# Patient Record
Sex: Female | Born: 1995 | Race: White | Hispanic: No | Marital: Single | Smoking: Former smoker
Health system: Southern US, Community
[De-identification: ages and names within clinical notes are randomized; demographics above are authoritative.]

## PROBLEM LIST (undated history)

## (undated) DIAGNOSIS — Z87440 Personal history of urinary (tract) infections: Secondary | ICD-10-CM

## (undated) DIAGNOSIS — H509 Unspecified strabismus: Secondary | ICD-10-CM

## (undated) DIAGNOSIS — N926 Irregular menstruation, unspecified: Secondary | ICD-10-CM

## (undated) HISTORY — PX: LEEP: SHX91

## (undated) HISTORY — DX: Irregular menstruation, unspecified: N92.6

## (undated) HISTORY — DX: Unspecified strabismus: H50.9

## (undated) HISTORY — DX: Personal history of urinary (tract) infections: Z87.440

## (undated) HISTORY — PX: WISDOM TOOTH EXTRACTION: SHX21

## (undated) HISTORY — PX: EYE SURGERY: SHX253

---
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
## 2014-09-15 ENCOUNTER — Encounter: Payer: Self-pay | Admitting: Family Medicine

## 2014-09-15 ENCOUNTER — Ambulatory Visit (INDEPENDENT_AMBULATORY_CARE_PROVIDER_SITE_OTHER): Payer: BLUE CROSS/BLUE SHIELD | Admitting: Family Medicine

## 2014-09-15 VITALS — BP 110/60 | HR 86 | Temp 98.1°F | Ht 66.0 in | Wt 120.0 lb

## 2014-09-15 DIAGNOSIS — Z113 Encounter for screening for infections with a predominantly sexual mode of transmission: Secondary | ICD-10-CM

## 2014-09-15 DIAGNOSIS — Z30019 Encounter for initial prescription of contraceptives, unspecified: Secondary | ICD-10-CM | POA: Diagnosis not present

## 2014-09-15 DIAGNOSIS — Z0000 Encounter for general adult medical examination without abnormal findings: Secondary | ICD-10-CM

## 2014-09-15 DIAGNOSIS — Z309 Encounter for contraceptive management, unspecified: Secondary | ICD-10-CM | POA: Insufficient documentation

## 2014-09-15 NOTE — Addendum Note (Signed)
Addended by: Ellamae Sia on: 09/15/2014 04:45 PM   Modules accepted: Orders

## 2014-09-15 NOTE — Assessment & Plan Note (Signed)
Education given regarding options for contraception, including barrier methods, injectable contraception, IUD placement, oral contraceptives. We agreed that condoms and depo provera injections would work well for her.  She wants to check with her insurance company first.

## 2014-09-15 NOTE — Patient Instructions (Addendum)
Good to see you. I think you should really consider depo provera every 3 month shots.  Give [place] a call once you figure our your health insurance.  Please make sure you use condoms with every sexual encounter.  We will call you with your lab results.

## 2014-09-15 NOTE — Addendum Note (Signed)
Addended by: Modena Nunnery on: 09/15/2014 02:06 PM   Modules accepted: Orders

## 2014-09-15 NOTE — Assessment & Plan Note (Signed)
Reviewed preventive care protocols, scheduled due services, and updated immunizations Discussed nutrition, exercise, diet, and healthy lifestyle.  

## 2014-09-15 NOTE — Progress Notes (Signed)
Subjective:   Patient ID: Jennifer [name], female    DOB: [birth year], 19 y.o.   MRN: [id number]  Jennifer [name] is a pleasant 19 y.o. year old female who presents to clinic today with Annual Exam and Contraception  on 09/15/2014  HPI:  Contraception management- Was taking Orthocyclen.  Forgets to take pill- wants to discuss other options for contraception- has not taken it in months.  She is currently sexually active.  She thinks her partner may have tested positive for chlamydia. She is not having any discharge or pelvic pain.  Otherwise doing well.   Current Outpatient Prescriptions on File Prior to Visit  Medication Sig Dispense Refill  . norgestimate-ethinyl estradiol (ORTHO-CYCLEN,SPRINTEC,PREVIFEM) 0.25-35 MG-MCG tablet Take 1 tablet by mouth daily. 3 Package 2   No current facility-administered medications on file prior to visit.    Allergies  Allergen Reactions  . Sulfa Antibiotics Hives    Past Medical History  Diagnosis Date  . Strabismus   . Irregular periods     No past surgical history on file.  Family History  Problem Relation Age of Onset  . Alcohol abuse Mother     History   Social History  . Marital Status: Single    Spouse Name: N/A  . Number of Children: N/A  . Years of Education: N/A   Occupational History  . Not on file.   Social History Main Topics  . Smoking status: Current Every Day Smoker  . Smokeless tobacco: Never Used  . Alcohol Use: Yes  . Drug Use: No  . Sexual Activity: Not on file   Other Topics Concern  . Not on file   Social History Narrative   10th grader, gets straight As.   Sexually active, uses condoms.   Lives with dad.   Mom is recovering alcoholic.   Has good support system- both grandmothers live near by.     The PMH, PSH, Social History, Family History, Medications, and allergies have been reviewed in [facility], and have been updated if relevant.  Review of Systems  Constitutional: Negative.     HENT: Negative.   Respiratory: Negative.   Cardiovascular: Negative.   Gastrointestinal: Negative.   Endocrine: Negative.   Genitourinary: Negative.   Musculoskeletal: Negative.   Skin: Negative.   Allergic/Immunologic: Negative.   Neurological: Negative.   Hematological: Negative.   Psychiatric/Behavioral: Negative.   All other systems reviewed and are negative.      Objective:    BP 110/60 mmHg  Pulse 86  Temp(Src) 98.1 F (36.7 C) (Oral)  Ht 5\' 6"  (1.676 m)  Wt 120 lb (54.432 kg)  BMI 19.38 kg/m2  SpO2 97%  LMP 08/31/2014 (Within Weeks)   Physical Exam  Constitutional: She is oriented to person, place, and time. She appears well-developed and well-nourished. No distress.  HENT:  Head: Normocephalic.  Eyes: Conjunctivae are normal.  Neck: Normal range of motion.  Cardiovascular: Normal rate and regular rhythm.   Pulmonary/Chest: Effort normal and breath sounds normal.  Abdominal: Soft. Bowel sounds are normal. She exhibits no distension and no mass. There is no tenderness. There is no rebound and no guarding. Hernia confirmed negative in the right inguinal area and confirmed negative in the left inguinal area.  Genitourinary: Rectum normal, vagina normal and uterus normal. No breast swelling or tenderness. There is no rash, tenderness, lesion or injury on the right labia. There is no rash, tenderness, lesion or injury on the left labia. Cervix exhibits no motion tenderness.  Right adnexum displays no mass, no tenderness and no fullness. Left adnexum displays no mass, no tenderness and no fullness.  Musculoskeletal: Normal range of motion.  Lymphadenopathy:       Right: No inguinal adenopathy present.       Left: No inguinal adenopathy present.  Neurological: She is alert and oriented to person, place, and time. No cranial nerve deficit.  Nursing note and vitals reviewed.         Assessment & Plan:   Routine general medical examination at a health care  facility  Screening for STD (sexually transmitted disease) - Plan: HIV antibody (with reflex), RPR, HSV(herpes simplex vrs) 1+2 ab-IgM  Encounter for initial prescription of contraceptives No Follow-up on file.

## 2014-09-15 NOTE — Assessment & Plan Note (Signed)
Counseled pt on using condoms with every sexual encounter. Test for GC/Chlamydia, HIV, HSV and RPR today. The patient indicates understanding of these issues and agrees with the plan.

## 2014-09-15 NOTE — Progress Notes (Signed)
Pre visit review using our clinic review tool, if applicable. No additional management support is needed unless otherwise documented below in the visit note. 

## 2014-09-16 LAB — HIV ANTIBODY (ROUTINE TESTING W REFLEX): HIV 1&2 Ab, 4th Generation: NONREACTIVE

## 2014-09-16 LAB — GC/CHLAMYDIA PROBE AMP
CT Probe RNA: POSITIVE — AB
GC Probe RNA: NEGATIVE

## 2014-09-16 LAB — RPR

## 2014-09-16 MED ORDER — AZITHROMYCIN 500 MG PO TABS: 1000.0000 mg | ORAL_TABLET | Freq: Once | ORAL | Status: DC

## 2014-09-16 NOTE — Addendum Note (Signed)
Addended by: Modena Nunnery on: 09/16/2014 03:24 PM   Modules accepted: Orders

## 2014-09-17 LAB — HSV(HERPES SIMPLEX VRS) I + II AB-IGM: Herpes Simplex Vrs I&II-IgM Ab (EIA): 2.25 INDEX — ABNORMAL HIGH

## 2014-09-24 ENCOUNTER — Telehealth: Payer: Self-pay | Admitting: Family Medicine

## 2014-09-24 NOTE — Telephone Encounter (Signed)
brandi called needed treatment information on Jennifer [name]

## 2014-09-24 NOTE — Telephone Encounter (Signed)
Spoke to brandi @ [facility] and advised her of pts recent Tx

## 2014-12-21 ENCOUNTER — Ambulatory Visit: Payer: Self-pay | Admitting: Family Medicine

## 2014-12-31 ENCOUNTER — Encounter: Payer: Self-pay | Admitting: Family Medicine

## 2014-12-31 ENCOUNTER — Ambulatory Visit (INDEPENDENT_AMBULATORY_CARE_PROVIDER_SITE_OTHER): Payer: BLUE CROSS/BLUE SHIELD | Admitting: Family Medicine

## 2014-12-31 VITALS — BP 106/60 | HR 66 | Temp 97.9°F | Wt 124.2 lb

## 2014-12-31 DIAGNOSIS — Z30019 Encounter for initial prescription of contraceptives, unspecified: Secondary | ICD-10-CM | POA: Diagnosis not present

## 2014-12-31 DIAGNOSIS — Z113 Encounter for screening for infections with a predominantly sexual mode of transmission: Secondary | ICD-10-CM

## 2014-12-31 DIAGNOSIS — Z0000 Encounter for general adult medical examination without abnormal findings: Secondary | ICD-10-CM | POA: Diagnosis not present

## 2014-12-31 LAB — LIPID PANEL
Cholesterol: 153 mg/dL (ref 0–200)
HDL: 46 mg/dL (ref >39.00)
LDL Cholesterol: 92 mg/dL (ref 0–99)
NonHDL: 106.72
Total CHOL/HDL Ratio: 3
Triglycerides: 72 mg/dL (ref 0.0–149.0)
VLDL: 14.4 mg/dL (ref 0.0–40.0)

## 2014-12-31 LAB — COMPREHENSIVE METABOLIC PANEL
ALT: 13 U/L (ref 0–35)
AST: 15 U/L (ref 0–37)
Albumin: 4.7 g/dL (ref 3.5–5.2)
Alkaline Phosphatase: 80 U/L (ref 39–117)
BUN: 10 mg/dL (ref 6–23)
CO2: 28 mEq/L (ref 19–32)
Calcium: 9.7 mg/dL (ref 8.4–10.5)
Chloride: 103 mEq/L (ref 96–112)
Creatinine, Ser: 0.71 mg/dL (ref 0.40–1.20)
GFR: 111.07 mL/min (ref >60.00)
Glucose, Bld: 74 mg/dL (ref 70–99)
Potassium: 3.8 mEq/L (ref 3.5–5.1)
Sodium: 140 mEq/L (ref 135–145)
Total Bilirubin: 0.4 mg/dL (ref 0.2–1.2)
Total Protein: 7.9 g/dL (ref 6.0–8.3)

## 2014-12-31 LAB — CBC WITH DIFFERENTIAL/PLATELET
Basophils Absolute: 0 10*3/uL (ref 0.0–0.1)
Basophils Relative: 0.4 % (ref 0.0–3.0)
Eosinophils Absolute: 0.1 10*3/uL (ref 0.0–0.7)
Eosinophils Relative: 0.6 % (ref 0.0–5.0)
HCT: 44.3 % (ref 36.0–46.0)
Hemoglobin: 14.9 g/dL (ref 12.0–15.0)
Lymphocytes Relative: 19.4 % (ref 12.0–46.0)
Lymphs Abs: 2 10*3/uL (ref 0.7–4.0)
MCHC: 33.6 g/dL (ref 30.0–36.0)
MCV: 92.3 fl (ref 78.0–100.0)
Monocytes Absolute: 0.7 10*3/uL (ref 0.1–1.0)
Monocytes Relative: 6.9 % (ref 3.0–12.0)
Neutro Abs: 7.5 10*3/uL (ref 1.4–7.7)
Neutrophils Relative %: 72.7 % (ref 43.0–77.0)
Platelets: 271 10*3/uL (ref 150.0–400.0)
RBC: 4.8 Mil/uL (ref 3.87–5.11)
RDW: 12.9 % (ref 11.5–14.6)
WBC: 10.3 10*3/uL (ref 4.5–10.5)

## 2014-12-31 LAB — TSH: TSH: 2.08 u[IU]/mL (ref 0.35–5.50)

## 2014-12-31 MED ORDER — MEDROXYPROGESTERONE ACETATE 150 MG/ML IM SUSP
150.0000 mg | Freq: Once | INTRAMUSCULAR | Status: AC
  Administered 2014-12-31: 150 mg via INTRAMUSCULAR

## 2014-12-31 NOTE — Assessment & Plan Note (Signed)
Discussed dangers of smoking, alcohol, and drug abuse.  Also discussed sexual activity, pregnancy risk, and STD risk.  Encouraged to get regular exercise.  Orders Placed This Encounter  Procedures  . CBC with Differential/Platelet  . Comprehensive metabolic panel  . Lipid panel  . TSH  . HIV antibody (with reflex)  . RPR

## 2014-12-31 NOTE — Addendum Note (Signed)
Addended by: Modena Nunnery on: 12/31/2014 01:25 PM   Modules accepted: Orders

## 2014-12-31 NOTE — Progress Notes (Signed)
Pre visit review using our clinic review tool, if applicable. No additional management support is needed unless otherwise documented below in the visit note. 

## 2014-12-31 NOTE — Patient Instructions (Signed)
Good luck at [facility]! We will call you with your results from today!

## 2014-12-31 NOTE — Assessment & Plan Note (Signed)
Education given regarding options for contraception, including barrier methods, injectable contraception, IUD placement, oral contraceptives  She is interested in IM depo- first injection received today.

## 2014-12-31 NOTE — Addendum Note (Signed)
Addended by: Ellamae Sia on: 12/31/2014 01:32 PM   Modules accepted: Orders

## 2014-12-31 NOTE — Addendum Note (Signed)
Addended by: Modena Nunnery on: 12/31/2014 01:36 PM   Modules accepted: Orders

## 2014-12-31 NOTE — Progress Notes (Signed)
Patient ID: Jennifer [name], female   DOB: 05/09/1996, 19 y.o.   MRN: [id number]   Subjective:   Patient ID: Jennifer [name], female    DOB: [birth year], 19 y.o.   MRN: [id number]  Jennifer [name] is a pleasant 19 y.o. year old female who presents to clinic today with Follow-up and Contraception  on 12/31/2014  HPI:  Doing well.  Still sexually active with same partner.  Remote h/o chlamydia.  Wants to be retested for Chlamydia.  Also wants to discussed birth control options.  Does not want to have to remember to take a pill every day.  Lab Results  Component Value Date   CHOL 147 10/11/2010   HDL 44.20 10/11/2010   LDLCALC 92 10/11/2010   TRIG 53.0 10/11/2010   CHOLHDL 3 10/11/2010   Lab Results  Component Value Date   WBC 7.4 10/11/2010   HGB 11.9* 10/11/2010   HCT 35.0* 10/11/2010   MCV 87.9 10/11/2010   PLT 286.0 10/11/2010   No results found for: TSH No current outpatient prescriptions on file prior to visit.   No current facility-administered medications on file prior to visit.    Allergies  Allergen Reactions  . Sulfa Antibiotics Hives    Past Medical History  Diagnosis Date  . Strabismus   . Irregular periods     No past surgical history on file.  Family History  Problem Relation Age of Onset  . Alcohol abuse Mother     History   Social History  . Marital Status: Single    Spouse Name: N/A  . Number of Children: N/A  . Years of Education: N/A   Occupational History  . Not on file.   Social History Main Topics  . Smoking status: Current Every Day Smoker  . Smokeless tobacco: Never Used  . Alcohol Use: Yes  . Drug Use: No  . Sexual Activity: Not on file   Other Topics Concern  . Not on file   Social History Narrative   Attending [employer] in the fall.   Sexually active, uses condoms.   Lives with dad.   Mom is recovering alcoholic.   Has good support system- both grandmothers live near by.     The PMH, PSH, Social  History, Family History, Medications, and allergies have been reviewed in [facility], and have been updated if relevant.    Review of Systems  Constitutional: Negative.   HENT: Negative.   Eyes: Negative.   Respiratory: Negative.   Cardiovascular: Negative.   Gastrointestinal: Negative.   Genitourinary: Negative.   Musculoskeletal: Negative.   Skin: Negative.   Allergic/Immunologic: Negative.   Neurological: Negative.   Hematological: Negative.   Psychiatric/Behavioral: Negative.        Objective:    BP 106/60 mmHg  Pulse 66  Temp(Src) 97.9 F (36.6 C) (Oral)  Wt 124 lb 4 oz (56.359 kg)  SpO2 97%  LMP 12/04/2014 (Within Weeks)   Physical Exam   General:  Well-developed,well-nourished,in no acute distress; alert,appropriate and cooperative throughout examination Head:  normocephalic and atraumatic.   Eyes:  vision grossly intact, pupils equal, pupils round, and pupils reactive to light.   Ears:  R ear normal and L ear normal.   Nose:  no external deformity.   Mouth:  good dentition.   Neck:  No deformities, masses, or tenderness noted. Breasts:  No mass, nodules, thickening, tenderness, bulging, retraction, inflamation, nipple discharge or skin changes noted.   Lungs:  Normal respiratory effort, chest expands  symmetrically. Lungs are clear to auscultation, no crackles or wheezes. Heart:  Normal rate and regular rhythm. S1 and S2 normal without gallop, murmur, click, rub or other extra sounds. Abdomen:  Bowel sounds positive,abdomen soft and non-tender without masses, organomegaly or hernias noted.d Msk:  No deformity or scoliosis noted of thoracic or lumbar spine.   Extremities:  No clubbing, cyanosis, edema, or deformity noted with normal full range of motion of all joints.   Neurologic:  alert & oriented X3 and gait normal.   Skin:  Intact without suspicious lesions or rashes Cervical Nodes:  No lymphadenopathy noted Axillary Nodes:  No palpable lymphadenopathy Psych:   Cognition and judgment appear intact. Alert and cooperative with normal attention span and concentration. No apparent delusions, illusions, hallucinations       Assessment & Plan:   Routine general medical examination at a health care facility  Screening for STD (sexually transmitted disease)  Encounter for initial prescription of contraceptives No Follow-up on file.

## 2015-01-01 LAB — GC/CHLAMYDIA PROBE AMP
CT Probe RNA: POSITIVE — AB
GC Probe RNA: NEGATIVE

## 2015-01-01 LAB — RPR

## 2015-01-01 LAB — HIV ANTIBODY (ROUTINE TESTING W REFLEX): HIV 1&2 Ab, 4th Generation: NONREACTIVE

## 2015-01-04 MED ORDER — AZITHROMYCIN 500 MG PO TABS: 1000.0000 mg | ORAL_TABLET | Freq: Once | ORAL | Status: DC

## 2015-01-04 NOTE — Addendum Note (Signed)
Addended by: Modena Nunnery on: 01/04/2015 10:59 AM   Modules accepted: Orders

## 2015-01-08 ENCOUNTER — Telehealth: Payer: Self-pay

## 2015-01-08 NOTE — Telephone Encounter (Signed)
Yes but we do need a test of cure to be sure.

## 2015-01-08 NOTE — Telephone Encounter (Signed)
Pt was seen 12/31/14 and pt took azithromycin 500 mg two tabs on 01/04/15;pt wants to know how soon the med is effective and is pt cured of chlamydia now. Pt understands she needs to return in 3 months for retesting for chlamydia.

## 2015-01-11 NOTE — Telephone Encounter (Signed)
Lm on pts vm requesting a call back 

## 2015-01-19 ENCOUNTER — Telehealth: Payer: Self-pay | Admitting: Family Medicine

## 2015-01-19 NOTE — Telephone Encounter (Signed)
Spoke to [facility] and confirmed pt received Tx for Dx

## 2015-01-19 NOTE — Telephone Encounter (Signed)
Jamas Lav from the [facility] Dept called regarding pt diagnosis and to see if treatment is completed. Please call [phone]. Thanks

## 2015-01-22 ENCOUNTER — Ambulatory Visit (INDEPENDENT_AMBULATORY_CARE_PROVIDER_SITE_OTHER): Payer: BLUE CROSS/BLUE SHIELD

## 2015-01-22 DIAGNOSIS — Z23 Encounter for immunization: Secondary | ICD-10-CM | POA: Diagnosis not present

## 2015-02-02 ENCOUNTER — Ambulatory Visit: Payer: BLUE CROSS/BLUE SHIELD

## 2015-03-23 ENCOUNTER — Ambulatory Visit: Payer: BLUE CROSS/BLUE SHIELD

## 2015-03-23 ENCOUNTER — Other Ambulatory Visit (INDEPENDENT_AMBULATORY_CARE_PROVIDER_SITE_OTHER): Payer: BLUE CROSS/BLUE SHIELD

## 2015-03-23 ENCOUNTER — Other Ambulatory Visit: Payer: Self-pay | Admitting: Internal Medicine

## 2015-03-23 DIAGNOSIS — N912 Amenorrhea, unspecified: Secondary | ICD-10-CM

## 2015-03-23 NOTE — Progress Notes (Signed)
Pt had nurse visit scheduled for Depo. Pt came in stating she had concerns of possible pregnancy. HcG Urine pregnancy test was done and results were negative. Patient wanted blood work done to make sure that it was a definite negative. Labs ordered, and blood drawn today. Patient will return for Depo upon receiving lab results.

## 2015-03-24 ENCOUNTER — Ambulatory Visit (INDEPENDENT_AMBULATORY_CARE_PROVIDER_SITE_OTHER): Payer: BLUE CROSS/BLUE SHIELD | Admitting: *Deleted

## 2015-03-24 DIAGNOSIS — Z308 Encounter for other contraceptive management: Secondary | ICD-10-CM

## 2015-03-24 LAB — HCG, SERUM, QUALITATIVE: Preg, Serum: <2

## 2015-03-24 MED ORDER — MEDROXYPROGESTERONE ACETATE 150 MG/ML IM SUSP
150.0000 mg | Freq: Once | INTRAMUSCULAR | Status: AC
  Administered 2015-03-24: 150 mg via INTRAMUSCULAR

## 2015-04-08 ENCOUNTER — Encounter: Payer: Self-pay | Admitting: Family Medicine

## 2015-04-08 ENCOUNTER — Ambulatory Visit (INDEPENDENT_AMBULATORY_CARE_PROVIDER_SITE_OTHER): Payer: BLUE CROSS/BLUE SHIELD | Admitting: Family Medicine

## 2015-04-08 VITALS — BP 112/66 | HR 70 | Temp 98.3°F | Wt 140.2 lb

## 2015-04-08 DIAGNOSIS — Z113 Encounter for screening for infections with a predominantly sexual mode of transmission: Secondary | ICD-10-CM | POA: Diagnosis not present

## 2015-04-08 DIAGNOSIS — A749 Chlamydial infection, unspecified: Secondary | ICD-10-CM | POA: Diagnosis not present

## 2015-04-08 NOTE — Progress Notes (Signed)
Pre visit review using our clinic review tool, if applicable. No additional management support is needed unless otherwise documented below in the visit note. 

## 2015-04-08 NOTE — Addendum Note (Signed)
Addended by: Daralene Milch C on: 04/08/2015 01:41 PM   Modules accepted: Orders

## 2015-04-08 NOTE — Progress Notes (Signed)
   Subjective:   Patient ID: Jennifer [name], female    DOB: [birth year], 19 y.o.   MRN: [id number]  Jennifer [name] is a pleasant 19 y.o. year old female who presents to clinic today with Follow-up  on 04/08/2015  HPI:  Treated for Chlamydia on 7/28 with 1 gram azithromycin .  Here for retesting.  Per pt, boyfriend said he was treated and was negative at follow up.  Currently not having any discharge, only menstrual spotting.  No current outpatient prescriptions on file prior to visit.   No current facility-administered medications on file prior to visit.    Allergies  Allergen Reactions  . Sulfa Antibiotics Hives    Past Medical History  Diagnosis Date  . Strabismus   . Irregular periods     No past surgical history on file.  Family History  Problem Relation Age of Onset  . Alcohol abuse Mother     Social History   Social History  . Marital Status: Single    Spouse Name: N/A  . Number of Children: N/A  . Years of Education: N/A   Occupational History  . Not on file.   Social History Main Topics  . Smoking status: Current Every Day Smoker  . Smokeless tobacco: Never Used  . Alcohol Use: Yes  . Drug Use: No  . Sexual Activity: Not on file   Other Topics Concern  . Not on file   Social History Narrative   Attending [employer] in the fall.   Sexually active, uses condoms.   Lives with dad.   Mom is recovering alcoholic.   Has good support system- both grandmothers live near by.     The PMH, PSH, Social History, Family History, Medications, and allergies have been reviewed in [facility], and have been updated if relevant.   Review of Systems  Constitutional: Negative.   Genitourinary: Negative for dysuria, flank pain, decreased urine volume, vaginal discharge, enuresis, vaginal pain, menstrual problem, pelvic pain and dyspareunia.  Neurological: Negative.   All other systems reviewed and are negative.      Objective:    BP 112/66 mmHg  Pulse 70   Temp(Src) 98.3 F (36.8 C) (Oral)  Wt 140 lb 4 oz (63.617 kg)  SpO2 98%   Physical Exam  Constitutional: She is oriented to person, place, and time. She appears well-developed and well-nourished. No distress.  HENT:  Head: Normocephalic and atraumatic.  Eyes: Conjunctivae are normal.  Cardiovascular: Normal rate.   Pulmonary/Chest: Effort normal.  Abdominal: Soft.  Genitourinary: Cervix exhibits no motion tenderness. Right adnexum displays no mass, no tenderness and no fullness.  Neurological: She is alert and oriented to person, place, and time. No cranial nerve deficit.  Skin: Skin is warm and dry.  Psychiatric: She has a normal mood and affect. Her behavior is normal. Judgment and thought content normal.          Assessment & Plan:   Chlamydia infection - Plan: GC/Chlamydia Probe Amp  Screening for STD (sexually transmitted disease) - Plan: HIV antibody (with reflex), RPR, GC/Chlamydia Probe Amp No Follow-up on file.

## 2015-04-08 NOTE — Assessment & Plan Note (Signed)
Finished course of azithromycin. Recheck today- GC/Chlamydia cervical swab done today. Orders Placed This Encounter  Procedures  . GC/Chlamydia Probe Amp  . HIV antibody (with reflex)  . RPR

## 2015-04-09 LAB — HIV ANTIBODY (ROUTINE TESTING W REFLEX): HIV 1&2 Ab, 4th Generation: NONREACTIVE

## 2015-04-09 LAB — RPR

## 2015-04-10 LAB — GC/CHLAMYDIA PROBE AMP
CT Probe RNA: NEGATIVE
GC Probe RNA: NEGATIVE

## 2015-04-12 ENCOUNTER — Ambulatory Visit: Payer: BLUE CROSS/BLUE SHIELD | Admitting: Family Medicine

## 2015-04-14 ENCOUNTER — Telehealth: Payer: Self-pay | Admitting: Family Medicine

## 2015-04-14 ENCOUNTER — Ambulatory Visit (INDEPENDENT_AMBULATORY_CARE_PROVIDER_SITE_OTHER): Payer: BLUE CROSS/BLUE SHIELD | Admitting: Family Medicine

## 2015-04-14 ENCOUNTER — Encounter: Payer: Self-pay | Admitting: Family Medicine

## 2015-04-14 ENCOUNTER — Other Ambulatory Visit (HOSPITAL_COMMUNITY)
Admission: RE | Admit: 2015-04-14 | Discharge: 2015-04-14 | Disposition: A | Discharge status: Home / Self Care | Payer: BLUE CROSS/BLUE SHIELD | Source: Ambulatory Visit | Attending: Family Medicine | Admitting: Family Medicine

## 2015-04-14 VITALS — BP 104/62 | HR 82 | Temp 98.4°F | Wt 140.5 lb

## 2015-04-14 DIAGNOSIS — Z01419 Encounter for gynecological examination (general) (routine) without abnormal findings: Secondary | ICD-10-CM | POA: Insufficient documentation

## 2015-04-14 DIAGNOSIS — Z1151 Encounter for screening for human papillomavirus (HPV): Secondary | ICD-10-CM | POA: Diagnosis not present

## 2015-04-14 DIAGNOSIS — N760 Acute vaginitis: Secondary | ICD-10-CM | POA: Insufficient documentation

## 2015-04-14 DIAGNOSIS — Z113 Encounter for screening for infections with a predominantly sexual mode of transmission: Secondary | ICD-10-CM

## 2015-04-14 DIAGNOSIS — A749 Chlamydial infection, unspecified: Secondary | ICD-10-CM | POA: Diagnosis not present

## 2015-04-14 NOTE — Assessment & Plan Note (Signed)
Discussed sexual activity, pregnancy risk, and STD risk.    STD screening today.

## 2015-04-14 NOTE — Progress Notes (Signed)
Subjective:   Patient ID: Jennifer [name], female    DOB: 30-Jun-1995, 19 y.o.   MRN: [id number]  Jennifer [name] is a pleasant 19 y.o. year old female who presents to clinic today with No chief complaint on file.  on 04/14/2015  HPI:  Was just seen last week for repeat STD testing.   Treated for Chlamydia on 7/28 with 1 gram azithromycin.  Per pt, boyfriend said he was treated and was negative at follow up.  GC/Chlamydia probe neg on 04/08/15. HIV and RPR also negative.  She called this morning asking for STD recheck since she  After she was tested last week,  she had intercourse with a different person and condom broke and she would like to be re-tested today.  Recent Results (from the past 2160 hour(s))  hCG, serum, qualitative     Status: None   Collection Time: 03/23/15  3:21 PM  Result Value Ref Range   Preg, Serum <2.0   GC/Chlamydia Probe Amp     Status: None   Collection Time: 04/08/15  1:00 PM  Result Value Ref Range   CT Probe RNA NEGATIVE    GC Probe RNA NEGATIVE     Comment:                                                                                         **Normal Reference Range: Negative**         Assay performed using the Gen-Probe APTIMA COMBO2 (R) Assay.   Acceptable specimen types for this assay include APTIMA Swabs (Unisex, endocervical, urethral, or vaginal), first void urine, and ThinPrep liquid based cytology samples.   HIV antibody (with reflex)     Status: None   Collection Time: 04/08/15  1:17 PM  Result Value Ref Range   HIV 1&2 Ab, 4th Generation NONREACTIVE NONREACTIVE    Comment:   HIV-1 antigen and HIV-1/HIV-2 antibodies were not detected.  There is no laboratory evidence of HIV infection.   HIV-1/2 Antibody Diff        Not indicated. HIV-1 RNA, Qual TMA          Not indicated.     PLEASE NOTE: This information has been disclosed to you from records whose confidentiality may be protected by state law. If your  state requires such protection, then the state law prohibits you from making any further disclosure of the information without the specific written consent of the person to whom it pertains, or as otherwise permitted by law. A general authorization for the release of medical or other information is NOT sufficient for this purpose.   The performance of this assay has not been clinically validated in patients less than 20 years old.   For additional information please refer to http://education.[employer].com/faq/FAQ106.  (This link is being provided for informational/educational purposes only.)     RPR     Status: None   Collection Time: 04/08/15  1:17 PM  Result Value Ref Range   RPR Ser Ql NON REAC NON REAC   Current Outpatient Prescriptions on File Prior to Visit  Medication Sig Dispense Refill  . medroxyPROGESTERone (DEPO-PROVERA) 150  MG/ML injection Inject 150 mg into the muscle every 3 (three) months.     No current facility-administered medications on file prior to visit.    Allergies  Allergen Reactions  . Sulfa Antibiotics Hives    Past Medical History  Diagnosis Date  . Strabismus   . Irregular periods     History reviewed. No pertinent past surgical history.  Family History  Problem Relation Age of Onset  . Alcohol abuse Mother     Social History   Social History  . Marital Status: Single    Spouse Name: N/A  . Number of Children: N/A  . Years of Education: N/A   Occupational History  . Not on file.   Social History Main Topics  . Smoking status: Current Every Day Smoker  . Smokeless tobacco: Never Used  . Alcohol Use: Yes  . Drug Use: No  . Sexual Activity: Not on file   Other Topics Concern  . Not on file   Social History Narrative   Attending [employer] in the fall.   Sexually active, uses condoms.   Lives with dad.   Mom is recovering alcoholic.   Has good support system- both grandmothers live near by.     The PMH, PSH, Social  History, Family History, Medications, and allergies have been reviewed in [facility], and have been updated if relevant.  Review of Systems  Constitutional: Negative.   Respiratory: Negative.   Cardiovascular: Negative.   Gastrointestinal: Negative.   Genitourinary: Negative.   Musculoskeletal: Negative.   Neurological: Negative.   All other systems reviewed and are negative.      Objective:    There were no vitals taken for this visit.   Physical Exam   General:  Well-developed,well-nourished,in no acute distress; alert,appropriate and cooperative throughout examination Head:  normocephalic and atraumatic.   Abdomen:  Bowel sounds positive,abdomen soft and non-tender without masses, organomegaly or hernias noted. Rectal:  no external abnormalities.   Genitalia:  Pelvic Exam:        External: normal female genitalia without lesions or masses        Vagina: normal without lesions or masses        Cervix: normal without lesions or masses        Adnexa: normal bimanual exam without masses or fullness        Uterus: normal by palpation        Pap smear: performed Msk:  No deformity or scoliosis noted of thoracic or lumbar spine.   Extremities:  No clubbing, cyanosis, edema, or deformity noted with normal full range of motion of all joints.   Neurologic:  alert & oriented X3 and gait normal.   Skin:  Intact without suspicious lesions or rashes Cervical Nodes:  No lymphadenopathy noted Axillary Nodes:  No palpable lymphadenopathy Psych:  Cognition and judgment appear intact. Alert and cooperative with normal attention span and concentration. No apparent delusions, illusions, hallucinations       Assessment & Plan:   Screening for STD (sexually transmitted disease)  Chlamydia infection  Encounter for routine gynecological examination No Follow-up on file.

## 2015-04-14 NOTE — Telephone Encounter (Signed)
Pt returned your call Pt would like to be retested  After She was tested last time she had intercourse with a different person and condom broke and she would like to be re-tested She would like to be tested for herpes and any other std's

## 2015-04-14 NOTE — Progress Notes (Signed)
Pre visit review using our clinic review tool, if applicable. No additional management support is needed unless otherwise documented below in the visit note. 

## 2015-04-14 NOTE — Addendum Note (Signed)
Addended by: Modena Nunnery on: 04/14/2015 01:27 PM   Modules accepted: Orders

## 2015-04-15 ENCOUNTER — Other Ambulatory Visit: Payer: Self-pay | Admitting: Family Medicine

## 2015-04-15 LAB — CYTOLOGY - PAP

## 2015-04-15 MED ORDER — FLUCONAZOLE 150 MG PO TABS: 150.0000 mg | ORAL_TABLET | Freq: Once | ORAL | Status: AC

## 2015-04-16 LAB — CERVICOVAGINAL ANCILLARY ONLY: Bacterial vaginitis: NEGATIVE

## 2015-04-19 LAB — CERVICOVAGINAL ANCILLARY ONLY: Herpes: NEGATIVE

## 2015-04-20 ENCOUNTER — Telehealth: Payer: Self-pay | Admitting: *Deleted

## 2015-04-20 MED ORDER — FLUCONAZOLE 150 MG PO TABS: 150.0000 mg | ORAL_TABLET | Freq: Once | ORAL | Status: DC

## 2015-04-20 NOTE — Telephone Encounter (Signed)
Spoke to pt who states that she tried OTC monistat and is requesting diflucan. OK to send to pharmacy per Dr Deborra Medina

## 2015-06-16 ENCOUNTER — Ambulatory Visit: Payer: BLUE CROSS/BLUE SHIELD | Admitting: Family Medicine

## 2015-06-21 ENCOUNTER — Ambulatory Visit: Payer: BLUE CROSS/BLUE SHIELD | Admitting: Family Medicine

## 2015-06-23 ENCOUNTER — Encounter: Payer: Self-pay | Admitting: Family Medicine

## 2015-06-23 ENCOUNTER — Ambulatory Visit (INDEPENDENT_AMBULATORY_CARE_PROVIDER_SITE_OTHER): Payer: BLUE CROSS/BLUE SHIELD | Admitting: Family Medicine

## 2015-06-23 VITALS — BP 122/86 | HR 85 | Temp 98.8°F | Wt 140.5 lb

## 2015-06-23 DIAGNOSIS — R05 Cough: Secondary | ICD-10-CM

## 2015-06-23 DIAGNOSIS — B9789 Other viral agents as the cause of diseases classified elsewhere: Principal | ICD-10-CM

## 2015-06-23 DIAGNOSIS — J069 Acute upper respiratory infection, unspecified: Secondary | ICD-10-CM | POA: Diagnosis not present

## 2015-06-23 DIAGNOSIS — R059 Cough, unspecified: Secondary | ICD-10-CM

## 2015-06-23 LAB — POCT INFLUENZA A/B
Influenza A, POC: NEGATIVE
Influenza B, POC: NEGATIVE

## 2015-06-23 MED ORDER — HYDROCODONE-HOMATROPINE 5-1.5 MG/5ML PO SYRP: 5.0000 mL | ORAL_SOLUTION | Freq: Every evening | ORAL | Status: DC | PRN

## 2015-06-23 NOTE — Progress Notes (Signed)
Subjective:    Patient ID: Jennifer [name], female    DOB: 1996/01/30, 20 y.o.   MRN: [id number]  HPI [place] symptoms  Night before last - ST and uncomfortable Then bad cough  Phlegm - yellow and darker   Used to smoke - quit in Oct   Not really wheezing   Did not check temp  Chills and body aches and gen weakness-sure she had a fever   Nasal congestion  ST - feels dry   Patient Active Problem List   Diagnosis Date Noted  . Encounter for routine gynecological examination 04/14/2015  . Chlamydia infection 04/08/2015  . Irregular periods 10/11/2010  . Screening for STD (sexually transmitted disease) 10/11/2010   Past Medical History  Diagnosis Date  . Strabismus   . Irregular periods    No past surgical history on file. Social History  Substance Use Topics  . Smoking status: Former Smoker    Quit date: 03/20/2015  . Smokeless tobacco: Never Used  . Alcohol Use: 0.0 oz/week    0 Standard drinks or equivalent per week     Comment: occ   Family History  Problem Relation Age of Onset  . Alcohol abuse Mother    Allergies  Allergen Reactions  . Sulfa Antibiotics Hives   Current Outpatient Prescriptions on File Prior to Visit  Medication Sig Dispense Refill  . medroxyPROGESTERone (DEPO-PROVERA) 150 MG/ML injection Inject 150 mg into the muscle every 3 (three) months.     No current facility-administered medications on file prior to visit.      Review of Systems  Constitutional: Positive for appetite change and fatigue. Negative for fever.  HENT: Positive for congestion, postnasal drip, rhinorrhea, sinus pressure, sneezing and sore throat. Negative for ear pain.   Eyes: Negative for pain and discharge.  Respiratory: Positive for cough. Negative for shortness of breath, wheezing and stridor.   Cardiovascular: Negative for chest pain.  Gastrointestinal: Negative for nausea, vomiting and diarrhea.  Genitourinary: Negative for urgency, frequency and hematuria.    Musculoskeletal: Negative for myalgias and arthralgias.  Skin: Negative for rash.  Neurological: Positive for headaches. Negative for dizziness, weakness and light-headedness.  Psychiatric/Behavioral: Negative for confusion and dysphoric mood.   Review of Systems         Objective:   Physical Exam  Constitutional: She appears well-developed and well-nourished. No distress.  Well appearing   HENT:  Head: Normocephalic and atraumatic.  Right Ear: External ear normal.  Left Ear: External ear normal.  Mouth/Throat: Oropharynx is clear and moist.  Nares are injected and congested  No sinus tenderness Clear rhinorrhea and post nasal drip   Eyes: Conjunctivae and EOM are normal. Pupils are equal, round, and reactive to light. Right eye exhibits no discharge. Left eye exhibits no discharge.  Neck: Normal range of motion. Neck supple.  Cardiovascular: Normal rate and normal heart sounds.   Pulmonary/Chest: Effort normal and breath sounds normal. No respiratory distress. She has no wheezes. She has no rales. She exhibits no tenderness.  Lymphadenopathy:    She has no cervical adenopathy.  Neurological: She is alert.  Skin: Skin is warm and dry. No rash noted.  Psychiatric: She has a normal mood and affect.          Assessment & Plan:   Problem List Items Addressed This Visit      Respiratory   Viral URI with cough    Reassuring exam Disc symptomatic care - see instructions on AVS Drink lots of  water /fluid and rest  Try hycodan for cough at night - caution/this will make you sleepy Try aleve 2 pills with a meal twice daily (every 12 hours)-this will help control fever  I recommend mucinex DM during the day for cough and congestion   Update if not starting to improve in a week or if worsening        Other Visit Diagnoses    Cough    -  Primary    Relevant Orders    POCT Influenza A/B (Completed)

## 2015-06-23 NOTE — Patient Instructions (Signed)
Drink lots of water /fluid and rest  Try hycodan for cough at night - caution/this will make you sleepy Try aleve 2 pills with a meal twice daily (every 12 hours)-this will help control fever  I recommend mucinex DM during the day for cough and congestion   Update if not starting to improve in a week or if worsening

## 2015-06-23 NOTE — Progress Notes (Signed)
Pre visit review using our clinic review tool, if applicable. No additional management support is needed unless otherwise documented below in the visit note. 

## 2015-06-24 ENCOUNTER — Ambulatory Visit: Payer: BLUE CROSS/BLUE SHIELD | Admitting: Family Medicine

## 2015-06-24 NOTE — Assessment & Plan Note (Signed)
Reassuring exam Disc symptomatic care - see instructions on AVS Drink lots of water /fluid and rest  Try hycodan for cough at night - caution/this will make you sleepy Try aleve 2 pills with a meal twice daily (every 12 hours)-this will help control fever  I recommend mucinex DM during the day for cough and congestion   Update if not starting to improve in a week or if worsening

## 2015-06-29 ENCOUNTER — Ambulatory Visit (INDEPENDENT_AMBULATORY_CARE_PROVIDER_SITE_OTHER): Payer: BLUE CROSS/BLUE SHIELD | Admitting: Family Medicine

## 2015-06-29 ENCOUNTER — Encounter: Payer: Self-pay | Admitting: Family Medicine

## 2015-06-29 VITALS — BP 100/60 | HR 79 | Temp 98.5°F | Wt 137.8 lb

## 2015-06-29 DIAGNOSIS — Z309 Encounter for contraceptive management, unspecified: Secondary | ICD-10-CM | POA: Diagnosis not present

## 2015-06-29 MED ORDER — MEDROXYPROGESTERONE ACETATE 150 MG/ML IM SUSP
150.0000 mg | Freq: Once | INTRAMUSCULAR | Status: AC
  Administered 2015-06-29: 150 mg via INTRAMUSCULAR

## 2015-06-29 NOTE — Assessment & Plan Note (Signed)
>  25 minutes spent in face to face time with patient, >50% spent in counselling or coordination of care Education given regarding options for contraception, including barrier methods, injectable contraception, IUD placement, oral contraceptives.  She would like to continue with IM depo provera for now.  Given today.

## 2015-06-29 NOTE — Progress Notes (Signed)
   Subjective:   Patient ID: Jennifer [name], female    DOB: Jan 05, 1996, 20 y.o.   MRN: [id number]  Jennifer [name] is a pleasant 20 y.o. year old female who presents to clinic today with Contraception  on 06/29/2015  HPI:  Has been receiving IM depo every three months- has received two doses. Overalls he is pleased with it- she is happy that she gained weight.  Does not like that she has been spotting.  Wants to talk to me about other options.  Current Outpatient Prescriptions on File Prior to Visit  Medication Sig Dispense Refill  . HYDROcodone-homatropine (HYCODAN) 5-1.5 MG/5ML syrup Take 5 mLs by mouth at bedtime as needed for cough. 120 mL 0  . medroxyPROGESTERone (DEPO-PROVERA) 150 MG/ML injection Inject 150 mg into the muscle every 3 (three) months.     No current facility-administered medications on file prior to visit.    Allergies  Allergen Reactions  . Sulfa Antibiotics Hives    Past Medical History  Diagnosis Date  . Strabismus   . Irregular periods     No past surgical history on file.  Family History  Problem Relation Age of Onset  . Alcohol abuse Mother     Social History   Social History  . Marital Status: Single    Spouse Name: N/A  . Number of Children: N/A  . Years of Education: N/A   Occupational History  . Not on file.   Social History Main Topics  . Smoking status: Former Smoker    Quit date: 03/20/2015  . Smokeless tobacco: Never Used  . Alcohol Use: 0.0 oz/week    0 Standard drinks or equivalent per week     Comment: occ  . Drug Use: No  . Sexual Activity: Not on file   Other Topics Concern  . Not on file   Social History Narrative   Attending [employer] in the fall.   Sexually active, uses condoms.   Lives with dad.   Mom is recovering alcoholic.   Has good support system- both grandmothers live near by.     The PMH, PSH, Social History, Family History, Medications, and allergies have been reviewed in [facility], and have  been updated if relevant.   Review of Systems  Gastrointestinal: Negative.   Genitourinary: Positive for vaginal bleeding. Negative for dysuria, frequency, flank pain, vaginal discharge, enuresis, difficulty urinating, vaginal pain, pelvic pain and dyspareunia.  All other systems reviewed and are negative.      Objective:    BP 100/60 mmHg  Pulse 79  Temp(Src) 98.5 F (36.9 C) (Oral)  Wt 137 lb 12 oz (62.483 kg)  SpO2 97%   Physical Exam  Constitutional: She is oriented to person, place, and time. She appears well-developed and well-nourished. No distress.  HENT:  Head: Normocephalic.  Eyes: Conjunctivae are normal.  Cardiovascular: Normal rate.   Pulmonary/Chest: Effort normal.  Musculoskeletal: Normal range of motion.  Neurological: She is alert and oriented to person, place, and time. No cranial nerve deficit.  Skin: Skin is warm and dry. She is not diaphoretic.  Psychiatric: She has a normal mood and affect. Her behavior is normal. Judgment and thought content normal.  Nursing note and vitals reviewed.         Assessment & Plan:   Encounter for contraceptive management, unspecified encounter No Follow-up on file.

## 2015-06-29 NOTE — Addendum Note (Signed)
Addended by: Modena Nunnery on: 06/29/2015 10:25 AM   Modules accepted: Orders

## 2015-06-29 NOTE — Progress Notes (Signed)
Pre visit review using our clinic review tool, if applicable. No additional management support is needed unless otherwise documented below in the visit note. 

## 2015-06-29 NOTE — Patient Instructions (Signed)
Good to see you. Please call the health department to ask about Nexplanon birth control.

## 2015-09-08 ENCOUNTER — Encounter: Payer: Self-pay | Admitting: Family Medicine

## 2015-09-08 NOTE — Progress Notes (Signed)
Pre visit review using our clinic review tool, if applicable. No additional management support is needed unless otherwise documented below in the visit note. 

## 2015-09-15 ENCOUNTER — Ambulatory Visit (INDEPENDENT_AMBULATORY_CARE_PROVIDER_SITE_OTHER): Payer: BLUE CROSS/BLUE SHIELD | Admitting: *Deleted

## 2015-09-15 DIAGNOSIS — Z3042 Encounter for surveillance of injectable contraceptive: Secondary | ICD-10-CM

## 2015-09-15 DIAGNOSIS — Z3049 Encounter for surveillance of other contraceptives: Secondary | ICD-10-CM | POA: Diagnosis not present

## 2015-09-15 MED ORDER — MEDROXYPROGESTERONE ACETATE 150 MG/ML IM SUSP
150.0000 mg | Freq: Once | INTRAMUSCULAR | Status: AC
  Administered 2015-09-15: 150 mg via INTRAMUSCULAR

## 2015-10-01 ENCOUNTER — Ambulatory Visit (INDEPENDENT_AMBULATORY_CARE_PROVIDER_SITE_OTHER): Payer: BLUE CROSS/BLUE SHIELD | Admitting: Internal Medicine

## 2015-10-01 ENCOUNTER — Encounter: Payer: Self-pay | Admitting: Internal Medicine

## 2015-10-01 VITALS — BP 108/70 | HR 74 | Temp 98.3°F | Wt 140.0 lb

## 2015-10-01 DIAGNOSIS — J039 Acute tonsillitis, unspecified: Secondary | ICD-10-CM | POA: Diagnosis not present

## 2015-10-01 LAB — POCT RAPID STREP A (OFFICE): Rapid Strep A Screen: NEGATIVE

## 2015-10-01 MED ORDER — PREDNISONE 20 MG PO TABS: 40.0000 mg | ORAL_TABLET | Freq: Every day | ORAL | Status: DC

## 2015-10-01 NOTE — Progress Notes (Signed)
Pre visit review using our clinic review tool, if applicable. No additional management support is needed unless otherwise documented below in the visit note. 

## 2015-10-01 NOTE — Addendum Note (Signed)
Addended by: Pilar Grammes on: 10/01/2015 03:33 PM   Modules accepted: Orders

## 2015-10-01 NOTE — Addendum Note (Signed)
Addended by: Pilar Grammes on: 10/01/2015 12:08 PM   Modules accepted: Orders

## 2015-10-01 NOTE — Assessment & Plan Note (Signed)
Probably viral (?EBV) Rapid strep negative Will check strep culture just in case Discussed continuing ibuprofen Brief course of prednisone

## 2015-10-01 NOTE — Progress Notes (Signed)
   Subjective:    Patient ID: Jennifer [name], female    DOB: 08/09/1995, 20 y.o.   MRN: [id number]  HPI Here for sore throat  Having bad throat pain--2 nights ago started Swelling and white spots Having body aches and feels weak  Some better with dayquil and ibuprofen Hard to swallow and AM fatigue  May have had fever yesterday Has swollen glands in neck No cough of note No ear pain No sig nasal congestion or drainage  Current Outpatient Prescriptions on File Prior to Visit  Medication Sig Dispense Refill  . medroxyPROGESTERone (DEPO-PROVERA) 150 MG/ML injection Inject 150 mg into the muscle every 3 (three) months.     No current facility-administered medications on file prior to visit.    Allergies  Allergen Reactions  . Sulfa Antibiotics Hives    Past Medical History  Diagnosis Date  . Strabismus   . Irregular periods     No past surgical history on file.  Family History  Problem Relation Age of Onset  . Alcohol abuse Mother     Social History   Social History  . Marital Status: Single    Spouse Name: N/A  . Number of Children: N/A  . Years of Education: N/A   Occupational History  . Not on file.   Social History Main Topics  . Smoking status: Former Smoker    Quit date: 03/20/2015  . Smokeless tobacco: Never Used  . Alcohol Use: 0.0 oz/week    0 Standard drinks or equivalent per week     Comment: occ  . Drug Use: No  . Sexual Activity: Not on file   Other Topics Concern  . Not on file   Social History Narrative   Attending [employer] in the fall.   Sexually active, uses condoms.   Lives with dad.   Mom is recovering alcoholic.   Has good support system- both grandmothers live near by.     Review of Systems Trouble sleeping due to throat Student and works at [employer] and [place] has cold--but not exposed recently Able to eat--just trouble with swallowing Vomited once this AM--- mild. No nausea though No diarrhea    Objective:   Physical Exam  Constitutional: She appears well-developed. No distress.  HENT:  No sinus tenderness Mild pale nasal congestion TMs normal 3+ tonsils with moderate inflammation and mild exudates  Neck: Normal range of motion. Neck supple. No thyromegaly present.  Mildly tender bilat ant cervical nodes  Pulmonary/Chest: Effort normal and breath sounds normal. No respiratory distress. She has no wheezes. She has no rales.  Abdominal: Soft. She exhibits no mass. There is no tenderness.  No HSM  Lymphadenopathy:    She has no axillary adenopathy.       Right: No inguinal adenopathy present.       Left: No inguinal adenopathy present.  Skin: No rash noted.          Assessment & Plan:

## 2015-10-03 LAB — CULTURE, GROUP A STREP: Organism ID, Bacteria: NORMAL

## 2015-10-06 ENCOUNTER — Telehealth: Payer: Self-pay | Admitting: Family Medicine

## 2015-10-06 NOTE — Telephone Encounter (Signed)
Patient returned Shannon's call. °

## 2015-10-06 NOTE — Telephone Encounter (Signed)
See throat culture results

## 2015-12-02 ENCOUNTER — Ambulatory Visit: Payer: BLUE CROSS/BLUE SHIELD

## 2015-12-13 ENCOUNTER — Encounter: Payer: Self-pay | Admitting: Family Medicine

## 2015-12-13 ENCOUNTER — Ambulatory Visit (INDEPENDENT_AMBULATORY_CARE_PROVIDER_SITE_OTHER): Payer: BLUE CROSS/BLUE SHIELD | Admitting: Family Medicine

## 2015-12-13 VITALS — BP 112/70 | HR 89 | Temp 98.6°F | Wt 147.0 lb

## 2015-12-13 DIAGNOSIS — Z309 Encounter for contraceptive management, unspecified: Secondary | ICD-10-CM | POA: Diagnosis not present

## 2015-12-13 DIAGNOSIS — Z113 Encounter for screening for infections with a predominantly sexual mode of transmission: Secondary | ICD-10-CM

## 2015-12-13 LAB — HIV ANTIBODY (ROUTINE TESTING W REFLEX): HIV 1&2 Ab, 4th Generation: NONREACTIVE

## 2015-12-13 MED ORDER — NORETHINDRONE ACET-ETHINYL EST 1.5-30 MG-MCG PO TABS: 1.0000 | ORAL_TABLET | Freq: Every day | ORAL | Status: DC

## 2015-12-13 NOTE — Progress Notes (Signed)
   Subjective:   Patient ID: Jennifer [name], female    DOB: Aug 16, 1995, 20 y.o.   MRN: [id number]  Jennifer [name] is a pleasant 20 y.o. year old female who presents to clinic today with Contraception  on 12/13/2015  HPI:  Has been receiving IM depo every three months- Overalls he is pleased with it- she is happy that she gained weight.  Does not like that she has been spotting.  Wants to talk to me about other options.  Wt Readings from Last 3 Encounters:  12/13/15 147 lb (66.679 kg)  10/01/15 140 lb (63.504 kg)  09/08/15 142 lb 12 oz (64.751 kg)    Also wants to be screened for STDs again today. Current Outpatient Prescriptions on File Prior to Visit  Medication Sig Dispense Refill  . medroxyPROGESTERone (DEPO-PROVERA) 150 MG/ML injection Inject 150 mg into the muscle every 3 (three) months.     No current facility-administered medications on file prior to visit.    Allergies  Allergen Reactions  . Sulfa Antibiotics Hives    Past Medical History  Diagnosis Date  . Strabismus   . Irregular periods     No past surgical history on file.  Family History  Problem Relation Age of Onset  . Alcohol abuse Mother     Social History   Social History  . Marital Status: Single    Spouse Name: N/A  . Number of Children: N/A  . Years of Education: N/A   Occupational History  . Not on file.   Social History Main Topics  . Smoking status: Former Smoker    Quit date: 03/20/2015  . Smokeless tobacco: Never Used  . Alcohol Use: 0.0 oz/week    0 Standard drinks or equivalent per week     Comment: occ  . Drug Use: No  . Sexual Activity: Not on file   Other Topics Concern  . Not on file   Social History Narrative   Attending [employer] in the fall.   Sexually active, uses condoms.   Lives with dad.   Mom is recovering alcoholic.   Has good support system- both grandmothers live near by.     The PMH, PSH, Social History, Family History, Medications, and  allergies have been reviewed in [facility], and have been updated if relevant.   Review of Systems  Gastrointestinal: Negative.   Genitourinary: Positive for vaginal bleeding. Negative for dysuria, frequency, flank pain, vaginal discharge, enuresis, difficulty urinating, vaginal pain, pelvic pain and dyspareunia.  All other systems reviewed and are negative.      Objective:    BP 112/70 mmHg  Pulse 89  Temp(Src) 98.6 F (37 C) (Oral)  Wt 147 lb (66.679 kg)  SpO2 99%   Physical Exam  Constitutional: She is oriented to person, place, and time. She appears well-developed and well-nourished. No distress.  HENT:  Head: Normocephalic.  Eyes: Conjunctivae are normal.  Cardiovascular: Normal rate.   Pulmonary/Chest: Effort normal.  Genitourinary: Uterus normal. No vaginal discharge found.  Musculoskeletal: Normal range of motion.  Neurological: She is alert and oriented to person, place, and time. No cranial nerve deficit.  Skin: Skin is warm and dry. She is not diaphoretic.  Psychiatric: She has a normal mood and affect. Her behavior is normal. Judgment and thought content normal.  Nursing note and vitals reviewed.         Assessment & Plan:   Encounter for contraceptive management, unspecified encounter No Follow-up on file.

## 2015-12-13 NOTE — Addendum Note (Signed)
Addended by: Modena Nunnery on: 12/13/2015 12:51 PM   Modules accepted: Orders

## 2015-12-13 NOTE — Progress Notes (Signed)
Pre visit review using our clinic review tool, if applicable. No additional management support is needed unless otherwise documented below in the visit note. 

## 2015-12-13 NOTE — Assessment & Plan Note (Signed)
Education given regarding options for contraception, including barrier methods, IUD placement, oral contraceptives  She would like to try OCPs again.  eRx sent for Loestrin.  She will keep me updated.

## 2015-12-13 NOTE — Assessment & Plan Note (Signed)
STD screening today.   Also discussed sexual activity, pregnancy risk, and STD risk.  Encouraged to get regular exercise.

## 2015-12-14 LAB — GC/CHLAMYDIA PROBE AMP
CT Probe RNA: NOT DETECTED
GC Probe RNA: NOT DETECTED

## 2015-12-14 LAB — RPR

## 2016-01-12 ENCOUNTER — Encounter: Payer: Self-pay | Admitting: Primary Care

## 2016-01-12 ENCOUNTER — Ambulatory Visit (INDEPENDENT_AMBULATORY_CARE_PROVIDER_SITE_OTHER): Payer: BLUE CROSS/BLUE SHIELD | Admitting: Primary Care

## 2016-01-12 VITALS — BP 118/70 | HR 88 | Temp 98.0°F | Wt 151.1 lb

## 2016-01-12 DIAGNOSIS — R350 Frequency of micturition: Secondary | ICD-10-CM

## 2016-01-12 LAB — POC URINALSYSI DIPSTICK (AUTOMATED)
Bilirubin, UA: NEGATIVE
Blood, UA: NEGATIVE
Glucose, UA: NEGATIVE
Ketones, UA: NEGATIVE
Leukocytes, UA: NEGATIVE
Nitrite, UA: NEGATIVE
Protein, UA: NEGATIVE
Spec Grav, UA: 1.02
Urobilinogen, UA: NEGATIVE
pH, UA: 8

## 2016-01-12 NOTE — Progress Notes (Signed)
Pre visit review using our clinic review tool, if applicable. No additional management support is needed unless otherwise documented below in the visit note. 

## 2016-01-12 NOTE — Progress Notes (Signed)
   Subjective:    Patient ID: Jennifer [name], female    DOB: 06-11-1995, 20 y.o.   MRN: [id number]  HPI  Jennifer [name] is a 20 year old female who presents today with a chief complaint of urinary frequency. She also reports difficulty emptying her bladder. She was diagnosed and treated for a urinary tract infection per her GYN in late July. She is unsure of the name of the antibiotic she was treated with, but she completed the antibiotic July 26th. She denies dysuria, hematuria, vaginal, vaginal itching, abdominal pain, nausea. She's not taken anything OTC. She's experienced difficulty in seeing her bladder with prior urinary tract infections and would like to ensure this one has dissipated.  Review of Systems  Constitutional: Negative for chills and fever.  Genitourinary: Negative for dysuria, flank pain, hematuria, pelvic pain and vaginal discharge.       Difficulty emptying bladder       Past Medical History:  Diagnosis Date  . Irregular periods   . Strabismus      Social History   Social History  . Marital status: Single    Spouse name: N/A  . Number of children: N/A  . Years of education: N/A   Occupational History  . Not on file.   Social History Main Topics  . Smoking status: Former Smoker    Quit date: 03/20/2015  . Smokeless tobacco: Never Used  . Alcohol use 0.0 oz/week     Comment: occ  . Drug use: No  . Sexual activity: Not on file   Other Topics Concern  . Not on file   Social History Narrative   Attending [employer] in the fall.   Sexually active, uses condoms.   Lives with dad.   Mom is recovering alcoholic.   Has good support system- both grandmothers live near by.      No past surgical history on file.  Family History  Problem Relation Age of Onset  . Alcohol abuse Mother     Allergies  Allergen Reactions  . Sulfa Antibiotics Hives    Current Outpatient Prescriptions on File Prior to Visit  Medication Sig Dispense Refill  .  Norethindrone Acetate-Ethinyl Estradiol (JUNEL,LOESTRIN,MICROGESTIN) 1.5-30 MG-MCG tablet Take 1 tablet by mouth daily. 1 Package 11   No current facility-administered medications on file prior to visit.     BP 118/70   Pulse 88   Temp 98 F (36.7 C) (Oral)   Wt 151 lb 1.9 oz (68.5 kg)   SpO2 98%   BMI 24.39 kg/m    Objective:   Physical Exam  Constitutional: She appears well-nourished. She does not appear ill.  Cardiovascular: Normal rate and regular rhythm.   Pulmonary/Chest: Effort normal and breath sounds normal.  Abdominal: Soft. Normal appearance and bowel sounds are normal. There is no tenderness. There is no CVA tenderness.  Skin: Skin is warm and dry.          Assessment & Plan:  Urinary frequency:  Also with difficulty emptying bladder. Treated for UTI per GYN in July, patient completed antibiotics. UA today: Negative for leuks, nitrites, blood. Discussed possibility of interstitial cystitis. Will have her trial  ibuprofen to help with spasming and possible inflammation of the bladder. Encouraged hydration with water and to notify if symptoms persist.  Sheral Flow, NP

## 2016-01-12 NOTE — Patient Instructions (Signed)
Your urine test was negative for infection.  Try taking ibuprofen 600 mg three times daily as needed for any possible inflammation to the bladder.  Limit consumption of caffeine and alcohol.  Please call your GYN if no improvement after a trial of ibuprofen.   It was a pleasure meeting you!

## 2016-03-08 ENCOUNTER — Telehealth: Payer: Self-pay | Admitting: Family Medicine

## 2016-03-08 NOTE — Telephone Encounter (Signed)
Yes please schedule OV to discuss.

## 2016-03-08 NOTE — Telephone Encounter (Signed)
Spoke to pt and advised. F/u appt scheduled.  

## 2016-03-08 NOTE — Telephone Encounter (Signed)
Patient would like to change birth control method.  She has not stopped bleeding and her hormones are crazy.  She wants to know if she has to make an appt to see the doctor to get her birth control changed.

## 2016-03-15 ENCOUNTER — Ambulatory Visit (INDEPENDENT_AMBULATORY_CARE_PROVIDER_SITE_OTHER): Payer: BLUE CROSS/BLUE SHIELD | Admitting: Family Medicine

## 2016-03-15 ENCOUNTER — Encounter: Payer: Self-pay | Admitting: Family Medicine

## 2016-03-15 VITALS — BP 120/76 | HR 75 | Temp 97.9°F | Wt 154.5 lb

## 2016-03-15 DIAGNOSIS — Z309 Encounter for contraceptive management, unspecified: Secondary | ICD-10-CM | POA: Diagnosis not present

## 2016-03-15 MED ORDER — NORETHINDRONE-ETH ESTRADIOL 1-35 MG-MCG PO TABS: 1.0000 | ORAL_TABLET | Freq: Every day | ORAL | 11 refills | Status: DC

## 2016-03-15 NOTE — Progress Notes (Signed)
Subjective:   Patient ID: Jennifer [name], female    DOB: 09-10-1995, 20 y.o.   MRN: [id number]  Jennifer [name] is a pleasant 20 y.o. year old female who presents to clinic today with Follow-up (discuss changing BCP) and Amenorrhea (bleeding constantly for 4-5 mos)  on 03/15/2016  HPI:  Had been receiving IM depo every three months- then switched to [place].  Has been spotting daily since switching to OCPs several months ago.  Would like to try a different OCP   Wt Readings from Last 3 Encounters:  03/15/16 154 lb 8 oz (70.1 kg)  01/12/16 151 lb 1.9 oz (68.5 kg)  12/13/15 147 lb (66.7 kg)    Also wants to be screened for STDs again today. Current Outpatient Prescriptions on File Prior to Visit  Medication Sig Dispense Refill  . Norethindrone Acetate-Ethinyl Estradiol (JUNEL,LOESTRIN,MICROGESTIN) 1.5-30 MG-MCG tablet Take 1 tablet by mouth daily. 1 Package 11   No current facility-administered medications on file prior to visit.     Allergies  Allergen Reactions  . Sulfa Antibiotics Hives    Past Medical History:  Diagnosis Date  . Irregular periods   . Strabismus     No past surgical history on file.  Family History  Problem Relation Age of Onset  . Alcohol abuse Mother     Social History   Social History  . Marital status: Single    Spouse name: N/A  . Number of children: N/A  . Years of education: N/A   Occupational History  . Not on file.   Social History Main Topics  . Smoking status: Former Smoker    Quit date: 03/20/2015  . Smokeless tobacco: Never Used  . Alcohol use 0.0 oz/week     Comment: occ  . Drug use: No  . Sexual activity: Not on file   Other Topics Concern  . Not on file   Social History Narrative   Attending [employer] in the fall.   Sexually active, uses condoms.   Lives with dad.   Mom is recovering alcoholic.   Has good support system- both grandmothers live near by.     The PMH, PSH, Social History, Family History,  Medications, and allergies have been reviewed in [facility], and have been updated if relevant.   Review of Systems  Gastrointestinal: Negative.   Genitourinary: Positive for vaginal bleeding. Negative for difficulty urinating, dyspareunia, dysuria, enuresis, flank pain, frequency, pelvic pain, vaginal discharge and vaginal pain.  All other systems reviewed and are negative.      Objective:    BP 120/76   Pulse 75   Temp 97.9 F (36.6 C) (Oral)   Wt 154 lb 8 oz (70.1 kg)   LMP  (LMP Unknown)   SpO2 98%   BMI 24.94 kg/m    Physical Exam  Constitutional: She is oriented to person, place, and time. She appears well-developed and well-nourished. No distress.  HENT:  Head: Normocephalic.  Eyes: Conjunctivae are normal.  Cardiovascular: Normal rate.   Pulmonary/Chest: Effort normal.  Genitourinary: Uterus normal. No vaginal discharge found.  Musculoskeletal: Normal range of motion.  Neurological: She is alert and oriented to person, place, and time. No cranial nerve deficit.  Skin: Skin is warm and dry. She is not diaphoretic.  Psychiatric: She has a normal mood and affect. Her behavior is normal. Judgment and thought content normal.  Nursing note and vitals reviewed.         Assessment & Plan:   Encounter for  contraceptive management, unspecified type No Follow-up on file.

## 2016-03-15 NOTE — Assessment & Plan Note (Signed)
>  15 minutes spent in face to face time with patient, >50% spent in counselling or coordination of care D/c Jennifer [name] Will try an OCP higher in estrogen. eRx sent for ortho novum.  She will keep me updated.

## 2016-03-15 NOTE — Progress Notes (Signed)
Pre visit review using our clinic review tool, if applicable. No additional management support is needed unless otherwise documented below in the visit note. 

## 2016-03-15 NOTE — Patient Instructions (Signed)
Great to see you. Please keep me updated. 

## 2016-09-06 DIAGNOSIS — L309 Dermatitis, unspecified: Secondary | ICD-10-CM | POA: Diagnosis not present

## 2016-10-09 DIAGNOSIS — H10413 Chronic giant papillary conjunctivitis, bilateral: Secondary | ICD-10-CM | POA: Diagnosis not present

## 2016-12-19 DIAGNOSIS — L309 Dermatitis, unspecified: Secondary | ICD-10-CM | POA: Diagnosis not present

## 2016-12-19 DIAGNOSIS — D225 Melanocytic nevi of trunk: Secondary | ICD-10-CM | POA: Diagnosis not present

## 2017-02-28 DIAGNOSIS — D485 Neoplasm of uncertain behavior of skin: Secondary | ICD-10-CM | POA: Diagnosis not present

## 2017-02-28 DIAGNOSIS — L080 Pyoderma: Secondary | ICD-10-CM | POA: Diagnosis not present

## 2017-10-18 DIAGNOSIS — N760 Acute vaginitis: Secondary | ICD-10-CM | POA: Diagnosis not present

## 2017-10-18 DIAGNOSIS — Z113 Encounter for screening for infections with a predominantly sexual mode of transmission: Secondary | ICD-10-CM | POA: Diagnosis not present

## 2017-11-23 ENCOUNTER — Encounter: Payer: Self-pay | Admitting: Family Medicine

## 2017-11-23 ENCOUNTER — Ambulatory Visit: Payer: BLUE CROSS/BLUE SHIELD | Admitting: Family Medicine

## 2017-11-23 VITALS — BP 110/58 | HR 82 | Temp 99.6°F | Ht 66.25 in | Wt 142.0 lb

## 2017-11-23 DIAGNOSIS — R300 Dysuria: Secondary | ICD-10-CM | POA: Diagnosis not present

## 2017-11-23 LAB — POC URINALSYSI DIPSTICK (AUTOMATED)
Bilirubin, UA: NEGATIVE
Glucose, UA: NEGATIVE
Nitrite, UA: NEGATIVE
Protein, UA: POSITIVE — AB
Spec Grav, UA: 1.02 (ref 1.010–1.025)
Urobilinogen, UA: 0.2 E.U./dL
pH, UA: 7 (ref 5.0–8.0)

## 2017-11-23 MED ORDER — ONDANSETRON HCL 4 MG PO TABS: 4.0000 mg | ORAL_TABLET | Freq: Three times a day (TID) | ORAL | 0 refills | Status: DC | PRN

## 2017-11-23 MED ORDER — CIPROFLOXACIN HCL 500 MG PO TABS
500.0000 mg | ORAL_TABLET | Freq: Two times a day (BID) | ORAL | 0 refills | Status: DC
Start: 2017-11-23 — End: 2017-12-18

## 2017-11-23 NOTE — Patient Instructions (Signed)
Drink plenty of water and start the antibiotics today.  Use zofran if needed for nausea.  We'll contact you with your lab report.   If you get worse, then go to the ER.  Take care.  Glad to see you.

## 2017-11-23 NOTE — Progress Notes (Signed)
Sx started yesterday.  First noted with abd sx, then pain in the R side of back.  Worse in the meantime.  No burning with urination but frequency and incomplete voiding which are a clear change for patient.  No known fevers.  Some chills last night.  Vomited last night but not today.  Dec appetite.  Taking fluids by mouth.  Still with R back and abd pain.   LMP ~10/26/17.  No missed periods.    Meds, vitals, and allergies reviewed.   ROS: Per HPI unless specifically indicated in ROS section   GEN: nad, alert and oriented, nontoxic but she looks like she doesn't feel well HEENT: mucous membranes moist NECK: supple w/o LA CV: rrr. PULM: ctab, no inc wob ABD: soft, +bs, R CVA with mild tenderness, abd not ttp, no rebound EXT: no edema SKIN: no acute rash

## 2017-11-24 DIAGNOSIS — M549 Dorsalgia, unspecified: Secondary | ICD-10-CM | POA: Diagnosis not present

## 2017-11-24 DIAGNOSIS — Z3202 Encounter for pregnancy test, result negative: Secondary | ICD-10-CM | POA: Diagnosis not present

## 2017-11-24 DIAGNOSIS — N3001 Acute cystitis with hematuria: Secondary | ICD-10-CM | POA: Diagnosis not present

## 2017-11-24 DIAGNOSIS — R109 Unspecified abdominal pain: Secondary | ICD-10-CM | POA: Diagnosis not present

## 2017-11-25 LAB — URINE CULTURE
MICRO NUMBER:: 90745398
SPECIMEN QUALITY:: ADEQUATE

## 2017-11-25 NOTE — Assessment & Plan Note (Signed)
Possibly early pyelo vs cystitis.  Given her age and health and current status, still okay for outpatient f/u.  Can take PO meds and fluids.  Start cipro, routine cipro cautions d/w pt.  zofran prn.  If worse, then to ER.  See notes on ucx, routine discussion with patient about u/a (and ucx results that are pending). She agrees with plan.  Likely not a renal stone baed on exam and hx.  She was asking about pain meds- if pain is bad enough to need med other than OTC pain meds, would rec ER re: eval and tx.

## 2017-11-26 ENCOUNTER — Telehealth: Payer: Self-pay | Admitting: Family Medicine

## 2017-11-26 NOTE — Telephone Encounter (Signed)
Dr Damita Dunnings out of office will fwd to Dr Darnell Level.

## 2017-11-26 NOTE — Telephone Encounter (Signed)
Bacteria is sensitive to both antibiotics. If she's doing better on cefdinir, finish this course and don't take cipro. Let us know if recurrent symptoms after treatment.

## 2017-11-26 NOTE — Telephone Encounter (Signed)
Patient called and says the new antibiotic is Cefdinir 300 mg 2 x day for 7 days. She asks does she need to continue that or start back on the Cipro? I advised this will be sent to Dr. Damita Dunnings and someone will call with his recommendation, she verbalized understanding.

## 2017-11-27 NOTE — Telephone Encounter (Signed)
Patient notified as instructed by telephone and verbalized understanding. 

## 2017-12-18 ENCOUNTER — Ambulatory Visit
Admission: RE | Admit: 2017-12-18 | Discharge: 2017-12-18 | Disposition: A | Discharge status: Home / Self Care | Payer: BLUE CROSS/BLUE SHIELD | Source: Ambulatory Visit | Attending: Primary Care | Admitting: Primary Care

## 2017-12-18 ENCOUNTER — Other Ambulatory Visit: Payer: Self-pay | Admitting: Primary Care

## 2017-12-18 ENCOUNTER — Telehealth: Payer: Self-pay

## 2017-12-18 ENCOUNTER — Encounter: Payer: Self-pay | Admitting: Primary Care

## 2017-12-18 ENCOUNTER — Ambulatory Visit: Payer: BLUE CROSS/BLUE SHIELD | Admitting: Primary Care

## 2017-12-18 VITALS — BP 116/68 | HR 72 | Temp 98.4°F | Ht 66.25 in | Wt 145.5 lb

## 2017-12-18 DIAGNOSIS — R109 Unspecified abdominal pain: Secondary | ICD-10-CM | POA: Diagnosis not present

## 2017-12-18 DIAGNOSIS — N760 Acute vaginitis: Secondary | ICD-10-CM | POA: Diagnosis not present

## 2017-12-18 DIAGNOSIS — R350 Frequency of micturition: Secondary | ICD-10-CM | POA: Diagnosis not present

## 2017-12-18 DIAGNOSIS — N390 Urinary tract infection, site not specified: Secondary | ICD-10-CM | POA: Diagnosis not present

## 2017-12-18 DIAGNOSIS — N898 Other specified noninflammatory disorders of vagina: Secondary | ICD-10-CM

## 2017-12-18 DIAGNOSIS — N1330 Unspecified hydronephrosis: Secondary | ICD-10-CM | POA: Diagnosis not present

## 2017-12-18 LAB — CBC WITH DIFFERENTIAL/PLATELET
Basophils Absolute: 0 10*3/uL (ref 0.0–0.1)
Basophils Relative: 0.2 % (ref 0.0–3.0)
Eosinophils Absolute: 0.1 10*3/uL (ref 0.0–0.7)
Eosinophils Relative: 0.6 % (ref 0.0–5.0)
HCT: 40 % (ref 36.0–46.0)
Hemoglobin: 13.7 g/dL (ref 12.0–15.0)
Lymphocytes Relative: 11 % — ABNORMAL LOW (ref 12.0–46.0)
Lymphs Abs: 1.4 10*3/uL (ref 0.7–4.0)
MCHC: 34.2 g/dL (ref 30.0–36.0)
MCV: 90.5 fl (ref 78.0–100.0)
Monocytes Absolute: 0.9 10*3/uL (ref 0.1–1.0)
Monocytes Relative: 7 % (ref 3.0–12.0)
Neutro Abs: 10 10*3/uL — ABNORMAL HIGH (ref 1.4–7.7)
Neutrophils Relative %: 81.2 % — ABNORMAL HIGH (ref 43.0–77.0)
Platelets: 257 10*3/uL (ref 150.0–400.0)
RBC: 4.42 Mil/uL (ref 3.87–5.11)
RDW: 13.5 % (ref 11.5–15.5)
WBC: 12.3 10*3/uL — ABNORMAL HIGH (ref 4.0–10.5)

## 2017-12-18 LAB — BASIC METABOLIC PANEL
BUN: 11 mg/dL (ref 6–23)
CO2: 30 mEq/L (ref 19–32)
Calcium: 10.2 mg/dL (ref 8.4–10.5)
Chloride: 102 mEq/L (ref 96–112)
Creatinine, Ser: 0.79 mg/dL (ref 0.40–1.20)
GFR: 95.53 mL/min (ref >60.00)
Glucose, Bld: 91 mg/dL (ref 70–99)
Potassium: 3.9 mEq/L (ref 3.5–5.1)
Sodium: 140 mEq/L (ref 135–145)

## 2017-12-18 LAB — POC URINALSYSI DIPSTICK (AUTOMATED)
Bilirubin, UA: NEGATIVE
Glucose, UA: NEGATIVE
Ketones, UA: NEGATIVE
Leukocytes, UA: NEGATIVE
Nitrite, UA: NEGATIVE
Protein, UA: NEGATIVE
Spec Grav, UA: 1.025 (ref 1.010–1.025)
Urobilinogen, UA: 0.2 E.U./dL
pH, UA: 6 (ref 5.0–8.0)

## 2017-12-18 MED ORDER — TAMSULOSIN HCL 0.4 MG PO CAPS: 0.4000 mg | ORAL_CAPSULE | Freq: Every day | ORAL | 0 refills | Status: DC

## 2017-12-18 MED ORDER — AMOXICILLIN-POT CLAVULANATE 875-125 MG PO TABS: 1.0000 | ORAL_TABLET | Freq: Two times a day (BID) | ORAL | 0 refills | Status: DC

## 2017-12-18 NOTE — Patient Instructions (Signed)
Stop by the lab prior to leaving today. I will notify you of your results once received.   We will be in touch once we receive your vaginal specimen and urine culture.   Stop by the front desk and speak with either Anastasiya regarding your ultrasound.  Continue Ibuprofen as needed.  It was a pleasure meeting you!

## 2017-12-18 NOTE — Assessment & Plan Note (Addendum)
Improved after 7 day course of Cefdinir provided at [facility], symptoms returned yesterday.   Differentials include renal stone, UTI, acute pyelonephritis, vaginal infection. Doubt significant infection given recent course of antibiotic treatment.   UA today with 3+ blood, otherwise negative. Culture sent. Check Wet Prep, gonorrhea/chlamydia and sent off for testing. Check CBC, BMP today. US renal ordered for evaluation of potential renal stone involvement.   She appears stable for outpatient treatment.

## 2017-12-18 NOTE — Telephone Encounter (Signed)
Spoke with patient in detail. Patient states that she really does not want to have CT scan done at this point. She does not want to have a lot of bils build up with this. Spoke with Alma Friendly, NP and advised patient that is ok but we are referring her to urologist for this, urgent referral. Patient instructed to take all of the medications as discussed and if symptoms get worse over night to go to ER. I will be working on urology referral on 12/19/17 and patient aware I will call her back with that information and to let us know if she gets [employer], RMA

## 2017-12-18 NOTE — Progress Notes (Signed)
Subjective:    Patient ID: Jennifer [name], female    DOB: November 14, 1995, 22 y.o.   MRN: [id number]  HPI  Jennifer [name] is a 22 year old female with a history of UTI who presents today with a chief complaint of flank pain.  She was last evaluated in late June 2019 with complaints of right lower back pain, urinary frequency, incomplete voiding. She was suspected to have early acute cystitis vs pyelonephritis and was treated with Ciprofloxacin. Urine culture returned with the presence of E. Coli that was sensitive to Cipro.  Since her last visit she [facility] the following day who found both the UTI and kidney infection and switched her to Cefdinir. She took one dose of Cipro total. She did not undergo renal stone evaluation or pelvic exam during her hospital stay. She completed a seven day course of Cefdinir, felt better for two weeks, then symptoms returned yesterday. This morning she woke up feeling right flank pain, urinary frequency, incomplete bladder emptying, nausea, increased vaginal discharge with mild odor.   She denies vaginal bleeding, hematuria, vomiting, fevers, abdominal pain. She took Ibuprofen this morning due to flank pain. She denies history of kidney stones. Her last UTI prior to June 2019 was in 2012.   Review of Systems  Constitutional: Negative for fever.  Genitourinary: Positive for difficulty urinating, flank pain, frequency and vaginal discharge. Negative for dysuria and hematuria.  Musculoskeletal: Positive for back pain.  Skin: Negative for color change.       Past Medical History:  Diagnosis Date  . Irregular periods   . Strabismus      Social History   Socioeconomic History  . Marital status: Single    Spouse name: Not on file  . Number of children: Not on file  . Years of education: Not on file  . Highest education level: Not on file  Occupational History  . Not on file  Social Needs  . Financial resource strain:  Not on file  . Food insecurity:    Worry: Not on file    Inability: Not on file  . Transportation needs:    Medical: Not on file    Non-medical: Not on file  Tobacco Use  . Smoking status: Former Smoker    Last attempt to quit: 03/20/2015    Years since quitting: 2.7  . Smokeless tobacco: Never Used  Substance and Sexual Activity  . Alcohol use: Yes    Alcohol/week: 0.0 oz    Comment: occ  . Drug use: No  . Sexual activity: Not on file  Lifestyle  . Physical activity:    Days per week: Not on file    Minutes per session: Not on file  . Stress: Not on file  Relationships  . Social connections:    Talks on phone: Not on file    Gets together: Not on file    Attends religious service: Not on file    Active member of club or organization: Not on file    Attends meetings of clubs or organizations: Not on file    Relationship status: Not on file  . Intimate partner violence:    Fear of current or ex partner: Not on file    Emotionally abused: Not on file    Physically abused: Not on file    Forced sexual activity: Not on file  Other Topics Concern  . Not on file  Social History Narrative   Attending [employer] in the fall.  Sexually active, uses condoms.   Lives with dad.   Mom is recovering alcoholic.   Has good support system- both grandmothers live near by.      No past surgical history on file.  Family History  Problem Relation Age of Onset  . Alcohol abuse Mother     Allergies  Allergen Reactions  . Sulfa Antibiotics Hives    No current outpatient medications on file prior to visit.   No current facility-administered medications on file prior to visit.     BP 116/68   Pulse 72   Temp 98.4 F (36.9 C) (Oral)   Ht 5' 6.25" (1.683 m)   Wt 145 lb 8 oz (66 kg)   LMP 11/26/2017   SpO2 98%   BMI 23.31 kg/m    Objective:   Physical Exam  Constitutional: She appears well-nourished.  Neck: Neck supple.  Cardiovascular: Normal rate and regular rhythm.    Respiratory: Effort normal and breath sounds normal.  GI: Soft. Normal appearance. There is no tenderness. There is no CVA tenderness.  Genitourinary: There is no tenderness on the right labia. There is no tenderness on the left labia. Cervix exhibits discharge. Cervix exhibits no motion tenderness. No erythema in the vagina. Vaginal discharge found.  Genitourinary Comments: Mild amount of whitish vaginal discharge. No foul smell.  Skin: Skin is warm and dry.           Assessment & Plan:

## 2017-12-19 LAB — C. TRACHOMATIS/N. GONORRHOEAE RNA
C. trachomatis RNA, TMA: NOT DETECTED
N. gonorrhoeae RNA, TMA: NOT DETECTED

## 2017-12-20 ENCOUNTER — Other Ambulatory Visit: Payer: Self-pay | Admitting: Primary Care

## 2017-12-20 DIAGNOSIS — B9689 Other specified bacterial agents as the cause of diseases classified elsewhere: Secondary | ICD-10-CM

## 2017-12-20 DIAGNOSIS — N760 Acute vaginitis: Principal | ICD-10-CM

## 2017-12-20 LAB — WET PREP BY MOLECULAR PROBE
Candida species: NOT DETECTED
MICRO NUMBER:: 90840986
SPECIMEN QUALITY:: ADEQUATE
Trichomonas vaginosis: NOT DETECTED

## 2017-12-20 LAB — URINE CULTURE
MICRO NUMBER:: 90840987
SPECIMEN QUALITY:: ADEQUATE

## 2017-12-20 MED ORDER — METRONIDAZOLE 0.75 % VA GEL: 1.0000 | Freq: Two times a day (BID) | VAGINAL | 0 refills | Status: DC

## 2017-12-24 ENCOUNTER — Other Ambulatory Visit: Payer: Self-pay | Admitting: Internal Medicine

## 2017-12-24 ENCOUNTER — Ambulatory Visit
Admission: RE | Admit: 2017-12-24 | Discharge: 2017-12-24 | Disposition: A | Discharge status: Home / Self Care | Payer: BLUE CROSS/BLUE SHIELD | Source: Ambulatory Visit | Attending: Primary Care | Admitting: Primary Care

## 2017-12-24 ENCOUNTER — Telehealth: Payer: Self-pay | Admitting: Family Medicine

## 2017-12-24 ENCOUNTER — Other Ambulatory Visit: Payer: BLUE CROSS/BLUE SHIELD

## 2017-12-24 DIAGNOSIS — R109 Unspecified abdominal pain: Secondary | ICD-10-CM

## 2017-12-24 DIAGNOSIS — N200 Calculus of kidney: Secondary | ICD-10-CM

## 2017-12-24 DIAGNOSIS — N132 Hydronephrosis with renal and ureteral calculous obstruction: Secondary | ICD-10-CM | POA: Diagnosis not present

## 2017-12-24 HISTORY — DX: Calculus of kidney: N20.0

## 2017-12-24 MED ORDER — TAMSULOSIN HCL 0.4 MG PO CAPS: 0.4000 mg | ORAL_CAPSULE | Freq: Every day | ORAL | 0 refills | Status: DC

## 2017-12-24 NOTE — Telephone Encounter (Signed)
Jennifer [name], CT Technologist with [facility] called in a CT Renal Stone Study revealing a 87mm calculus in the distal right ureter with resultant moderate right hydroureteronephrosis. Results read back and verified in Epic. Dr. Scarlette Calico, on call physician, made aware of results.

## 2017-12-25 NOTE — Telephone Encounter (Signed)
Noted. Addressed in result notes already.

## 2017-12-26 ENCOUNTER — Telehealth: Payer: Self-pay

## 2017-12-26 DIAGNOSIS — B9689 Other specified bacterial agents as the cause of diseases classified elsewhere: Secondary | ICD-10-CM

## 2017-12-26 DIAGNOSIS — N760 Acute vaginitis: Principal | ICD-10-CM

## 2017-12-26 MED ORDER — METRONIDAZOLE 500 MG PO TABS: 500.0000 mg | ORAL_TABLET | Freq: Two times a day (BID) | ORAL | 0 refills | Status: DC

## 2017-12-26 NOTE — Telephone Encounter (Signed)
Yes, we can send in metronidazole pills. You cannot drink alcohol while taking the pills as it will cause a horrible reaction. Take 1 tablet BID x 7 days.

## 2017-12-26 NOTE — Telephone Encounter (Signed)
Spoken and notified patient of Kate Clark's comments. Patient verbalized understanding.  

## 2017-12-26 NOTE — Telephone Encounter (Signed)
Copied from [place] [id number]. Topic: Referral - Question >> Dec 26, 2017  9:30 AM Ivar Drape wrote: Reason for CRM:   Patient would like a call back concerning her Urology Referral  Spoke with patient, she states she did not have a question about referral but about a medication. Metronidazole cream will cost patient $115 and she wants to know if there is a pill version for this or something cheaper she can take? Please review. Thank Edrick Kins, RMA

## 2018-01-01 ENCOUNTER — Encounter: Payer: Self-pay | Admitting: Urology

## 2018-01-01 ENCOUNTER — Ambulatory Visit (INDEPENDENT_AMBULATORY_CARE_PROVIDER_SITE_OTHER): Payer: BLUE CROSS/BLUE SHIELD | Admitting: Urology

## 2018-01-01 VITALS — BP 120/78 | HR 82 | Ht 66.0 in | Wt 145.0 lb

## 2018-01-01 DIAGNOSIS — N1330 Unspecified hydronephrosis: Secondary | ICD-10-CM | POA: Diagnosis not present

## 2018-01-01 DIAGNOSIS — N201 Calculus of ureter: Secondary | ICD-10-CM

## 2018-01-01 DIAGNOSIS — N200 Calculus of kidney: Secondary | ICD-10-CM | POA: Diagnosis not present

## 2018-01-01 NOTE — Progress Notes (Signed)
01/01/2018 1:49 PM   Jennifer [name] 04-May-1996 [id number]  Referring provider: Lucille Passy, MD [place], [place] [address]  Chief Complaint  Patient presents with  . Nephrolithiasis    New patient    HPI: 22 year old female who presents today to discuss treatment of a 5 mm right distal ureteral calculus.  She initially was seen and evaluated in the emergency room at [facility] on December 24, 2017.  At that point time, she had 2 days of acute right-sided flank pain with nausea and vomiting.  She was previously diagnosed with a urinary tract infection just prior to this and was on Cipro at the time.  Her antibiotics were switched to Cefdinir.  No imaging was obtained at the time.  CT scan ordered by her primary care on 12/24/2017 showed a 5 mm right distal ureteral calculus with resultant moderate hydroureteronephrosis down to this level.  She continues to have intermittent episodes of left flank pain.  She denies any left lower quadrant pain.  She reports this happened suddenly without provoking factors or alleviating factors.  No dysuria or gross hematuria.  No fevers or chills recently.  No prior history of kidney stones.   PMH: Past Medical History:  Diagnosis Date  . Irregular periods   . Strabismus     Surgical History: History reviewed. No pertinent surgical history.  Home Medications:  Allergies as of 01/01/2018      Reactions   Sulfa Antibiotics Hives      Medication List        Accurate as of 01/01/18  1:49 PM. Always use your most recent med list.          tamsulosin 0.4 MG Caps capsule Commonly known as:  FLOMAX Take 1 capsule (0.4 mg total) by mouth daily.       Allergies:  Allergies  Allergen Reactions  . Sulfa Antibiotics Hives    Family History: Family History  Problem Relation Age of Onset  . Alcohol abuse Mother     Social History:  reports that she quit smoking about 2 years ago. She has never  used smokeless tobacco. She reports that she drinks alcohol. She reports that she does not use drugs.  ROS: UROLOGY Frequent Urination?: Yes Hard to postpone urination?: No Burning/pain with urination?: No Get up at night to urinate?: Yes Leakage of urine?: No Urine stream starts and stops?: No Trouble starting stream?: Yes Do you have to strain to urinate?: Yes Blood in urine?: Yes Urinary tract infection?: Yes Sexually transmitted disease?: No Injury to kidneys or bladder?: No Painful intercourse?: No Weak stream?: No Currently pregnant?: No Vaginal bleeding?: No Last menstrual period?: 01/01/18  Gastrointestinal Nausea?: Yes Vomiting?: No Indigestion/heartburn?: Yes Diarrhea?: No Constipation?: No  Constitutional Fever: Yes Night sweats?: No Weight loss?: Yes Fatigue?: No  Skin Skin rash/lesions?: No Itching?: No  Eyes Blurred vision?: No Double vision?: No  Ears/Nose/Throat Sore throat?: No Sinus problems?: No  Hematologic/Lymphatic Swollen glands?: No Easy bruising?: No  Cardiovascular Leg swelling?: No Chest pain?: No  Respiratory Cough?: No Shortness of breath?: No  Endocrine Excessive thirst?: Yes  Musculoskeletal Back pain?: Yes Joint pain?: No  Neurological Headaches?: No Dizziness?: No  Psychologic Depression?: No Anxiety?: No  Physical Exam: BP 120/78   Pulse 82   Ht 5\' 6"  (1.676 m)   Wt 145 lb (65.8 kg)   BMI 23.40 kg/m   Constitutional:  Alert and oriented, No acute distress. HEENT: [place] AT,  moist mucus membranes.  Trachea midline, no masses. Cardiovascular: No clubbing, cyanosis, or edema. Respiratory: Normal respiratory effort, no increased work of breathing. GI: Abdomen is soft, nontender, nondistended, no abdominal masses GU: No CVA tenderness Skin: No rashes, bruises or suspicious lesions. Neurologic: Grossly intact, no focal deficits, moving all 4 extremities. Psychiatric: Normal mood and affect.  Laboratory  Data: Lab Results  Component Value Date   WBC 12.3 (H) 12/18/2017   HGB 13.7 12/18/2017   HCT 40.0 12/18/2017   MCV 90.5 12/18/2017   PLT 257.0 12/18/2017    Lab Results  Component Value Date   CREATININE 0.79 12/18/2017   Urinalysis Results for orders placed or performed in visit on 01/01/18  Microscopic Examination  Result Value Ref Range   WBC, UA None seen 0 - 5 /hpf   RBC, UA 3-10 (A) 0 - 2 /hpf   Epithelial Cells (non renal) 0-10 0 - 10 /hpf   Mucus, UA Present (A) Not Estab.   Bacteria, UA Few (A) None seen/Few  Urinalysis, Complete  Result Value Ref Range   Specific Gravity, UA 1.020 1.005 - 1.030   pH, UA 7.0 5.0 - 7.5   Color, UA Yellow Yellow   Appearance Ur Cloudy (A) Clear   Leukocytes, UA Negative Negative   Protein, UA 1+ (A) Negative/Trace   Glucose, UA Negative Negative   Ketones, UA Negative Negative   RBC, UA 3+ (A) Negative   Bilirubin, UA Negative Negative   Urobilinogen, Ur 0.2 0.2 - 1.0 mg/dL   Nitrite, UA Negative Negative   Microscopic Examination See below:     Pertinent Imaging: Results for orders placed during the hospital encounter of 12/18/17  US Renal   Narrative CLINICAL DATA:  UTI, hematuria.  Pain rule out stone  EXAM: RENAL / URINARY TRACT ULTRASOUND COMPLETE  COMPARISON:  None.  FINDINGS: Right Kidney:  Length: 11.8 cm. Moderate hydronephrosis on the right. No mass lesion. No renal calculi.  Left Kidney:  Length: 11.5 cm. Echogenicity within normal limits. No mass or hydronephrosis visualized.  Bladder:  Ureteral jets not identified bilaterally  IMPRESSION: Moderate right hydronephrosis. This could be due to obstructing stone. Correlate with symptoms. CT abdomen pelvis may be helpful to evaluate for stone disease.   Electronically Signed   By: Franchot Gallo M.D.   On: 12/18/2017 15:26    Results for orders placed during the hospital encounter of 12/24/17  CT RENAL STONE STUDY   Narrative CLINICAL DATA:   Intermittent right-sided flank pain for the past few weeks.  EXAM: CT ABDOMEN AND PELVIS WITHOUT CONTRAST  TECHNIQUE: Multidetector CT imaging of the abdomen and pelvis was performed following the standard protocol without IV contrast.  COMPARISON:  Renal ultrasound dated December 18, 2017.  FINDINGS: Lower chest: No acute abnormality.  Hepatobiliary: No focal liver abnormality is seen. No gallstones, gallbladder wall thickening, or biliary dilatation.  Pancreas: Unremarkable. No pancreatic ductal dilatation or surrounding inflammatory changes.  Spleen: Normal in size without focal abnormality.  Adrenals/Urinary Tract: The adrenal glands are unremarkable. There is a 5 mm calculus in the distal right ureter with resultant moderate right hydroureteronephrosis. The left kidney is unremarkable. The bladder is unremarkable for the degree of distention.  Stomach/Bowel: Stomach is within normal limits. Appendix appears normal. No evidence of bowel wall thickening, distention, or inflammatory changes.  Vascular/Lymphatic: No significant vascular findings are present. No enlarged abdominal or pelvic lymph nodes.  Reproductive: Uterus and bilateral adnexa are unremarkable.  Other: Trace free fluid  in the pelvis is likely physiologic. No pneumoperitoneum.  Musculoskeletal: No acute or significant osseous findings.  IMPRESSION: 1. 5 mm calculus in the distal right ureter with resultant moderate right hydroureteronephrosis.   Electronically Signed   By: Titus Dubin M.D.   On: 12/24/2017 17:13    Renal ultrasound and CT scan were personally reviewed today with the patient.  Agree with above assessment.  Assessment & Plan:    1. Right ureteral stone Although the stone is relatively small, it has failed to pass despite more than adequate time, I recommended surgical intervention.  We discussed various treatment options including ESWL vs. ureteroscopy, laser lithotripsy,  and stent. We discussed the risks and benefits of both including bleeding, infection, damage to surrounding structures, efficacy with need for possible further intervention, and need for temporary ureteral stent.  We also discussed the difference in efficacy between the 2, ureteroscopy being much higher stone free rate but more invasive.  She is most interested in shockwave lithotripsy.  I feel that he is a good candidate especially given her habitus.  She understands that if shockwave lithotripsy fails, she will need to have ureteroscopy with stent.  All questions were answered.  Warning symptoms reviewed.  She will seek emergent medical attention if she develops poorly controlled pain, fevers, concerning signs.   - Urinalysis, Complete - CULTURE, URINE COMPREHENSIVE  2. Hydronephrosis of right kidney Secondary to #1   Hollice Espy, MD  [place] [address] [place], [place] [address] [phone]

## 2018-01-02 ENCOUNTER — Other Ambulatory Visit: Payer: Self-pay | Admitting: Radiology

## 2018-01-02 DIAGNOSIS — N200 Calculus of kidney: Secondary | ICD-10-CM

## 2018-01-02 LAB — URINALYSIS, COMPLETE
Bilirubin, UA: NEGATIVE
Glucose, UA: NEGATIVE
Ketones, UA: NEGATIVE
Leukocytes, UA: NEGATIVE
Nitrite, UA: NEGATIVE
Specific Gravity, UA: 1.02 (ref 1.005–1.030)
Urobilinogen, Ur: 0.2 mg/dL (ref 0.2–1.0)
pH, UA: 7 (ref 5.0–7.5)

## 2018-01-02 LAB — MICROSCOPIC EXAMINATION: WBC, UA: NONE SEEN /hpf (ref 0–5)

## 2018-01-03 ENCOUNTER — Other Ambulatory Visit: Payer: Self-pay

## 2018-01-03 ENCOUNTER — Ambulatory Visit
Admission: RE | Admit: 2018-01-03 | Discharge: 2018-01-03 | Disposition: A | Discharge status: Home / Self Care | Payer: BLUE CROSS/BLUE SHIELD | Source: Ambulatory Visit | Attending: Urology | Admitting: Urology

## 2018-01-03 ENCOUNTER — Encounter
Admission: RE | Disposition: A | Discharge status: Home / Self Care | Payer: Self-pay | Source: Ambulatory Visit | Attending: Urology

## 2018-01-03 ENCOUNTER — Ambulatory Visit: Payer: BLUE CROSS/BLUE SHIELD

## 2018-01-03 ENCOUNTER — Encounter: Payer: Self-pay | Admitting: *Deleted

## 2018-01-03 DIAGNOSIS — N201 Calculus of ureter: Secondary | ICD-10-CM

## 2018-01-03 DIAGNOSIS — N132 Hydronephrosis with renal and ureteral calculous obstruction: Secondary | ICD-10-CM | POA: Diagnosis not present

## 2018-01-03 DIAGNOSIS — Z87440 Personal history of urinary (tract) infections: Secondary | ICD-10-CM | POA: Diagnosis not present

## 2018-01-03 DIAGNOSIS — N200 Calculus of kidney: Secondary | ICD-10-CM | POA: Diagnosis not present

## 2018-01-03 HISTORY — PX: EXTRACORPOREAL SHOCK WAVE LITHOTRIPSY: SHX1557

## 2018-01-03 LAB — POCT PREGNANCY, URINE: Preg Test, Ur: NEGATIVE

## 2018-01-03 SURGERY — LITHOTRIPSY, ESWL
Anesthesia: Moderate Sedation | Laterality: Right

## 2018-01-03 MED ORDER — SODIUM CHLORIDE 0.9 % IV SOLN
INTRAVENOUS | Status: DC
  Administered 2018-01-03: 08:00:00 via INTRAVENOUS

## 2018-01-03 MED ORDER — DIPHENHYDRAMINE HCL 25 MG PO CAPS
ORAL_CAPSULE | ORAL | Status: AC
  Administered 2018-01-03: 25 mg via ORAL
  Filled 2018-01-03: qty 1

## 2018-01-03 MED ORDER — CIPROFLOXACIN HCL 500 MG PO TABS
500.0000 mg | ORAL_TABLET | ORAL | Status: AC
  Administered 2018-01-03: 500 mg via ORAL

## 2018-01-03 MED ORDER — CIPROFLOXACIN HCL 500 MG PO TABS
ORAL_TABLET | ORAL | Status: AC
  Administered 2018-01-03: 500 mg via ORAL
  Filled 2018-01-03: qty 1

## 2018-01-03 MED ORDER — DIPHENHYDRAMINE HCL 25 MG PO CAPS
25.0000 mg | ORAL_CAPSULE | ORAL | Status: AC
  Administered 2018-01-03: 25 mg via ORAL

## 2018-01-03 MED ORDER — HYDROCODONE-ACETAMINOPHEN 5-325 MG PO TABS: 1.0000 | ORAL_TABLET | ORAL | 0 refills | Status: DC | PRN

## 2018-01-03 MED ORDER — DIAZEPAM 5 MG PO TABS
ORAL_TABLET | ORAL | Status: AC
  Administered 2018-01-03: 10 mg via ORAL
  Filled 2018-01-03: qty 2

## 2018-01-03 MED ORDER — DIAZEPAM 5 MG PO TABS
10.0000 mg | ORAL_TABLET | ORAL | Status: AC
  Administered 2018-01-03: 10 mg via ORAL

## 2018-01-03 NOTE — Discharge Instructions (Signed)

## 2018-01-04 LAB — CULTURE, URINE COMPREHENSIVE

## 2018-01-16 NOTE — Progress Notes (Signed)
01/17/2018 10:52 AM   Jennifer [name] 04-23-1996 [id number]  Referring provider: Lucille Passy, MD [place], [place] [address]  Chief Complaint  Patient presents with  . Nephrolithiasis    HPI: Patient is a 22 year old Caucasian female who is s/p right ESWL on 01/03/2018.  Background history 22 year old female who presents today to discuss treatment of a 5 mm right distal ureteral calculus.  [facility]  At that point time, she had 2 days of acute right-sided flank pain with nausea and vomiting.  She was previously diagnosed with a urinary tract infection just prior to this and was on Cipro at the time.  Her antibiotics were switched to Cefdinir.  No imaging was obtained at the time.  CT scan ordered by her primary care on 12/24/2017 showed a 5 mm right distal ureteral calculus with resultant moderate hydroureteronephrosis down to this level.  She continues to have intermittent episodes of left flank pain.  She denies any left lower quadrant pain.  She reports this happened suddenly without provoking factors or alleviating factors.  No dysuria or gross hematuria.  No fevers or chills recently.  No prior history of kidney stones.  Post procedural course was as expected and uneventful.  Stone appeared smudged at the end of the procedure.    She states the night of the procedure, she had a lot of flank pain.  She passed the fragments that night and the pain abated.  Patient denies any gross hematuria, dysuria or suprapubic/flank pain.  Patient denies any fevers, chills, nausea or vomiting.   KUB on 01/17/2018 noted distal right ureteral calculus noted on prior exam is not clearly visualized currently. No evidence of bowel obstruction or ileus.   PMH: Past Medical History:  Diagnosis Date  . Irregular periods   . Strabismus     Surgical History: Past Surgical History:    Procedure Laterality Date  . EXTRACORPOREAL SHOCK WAVE LITHOTRIPSY Right 01/03/2018   Procedure: EXTRACORPOREAL SHOCK WAVE LITHOTRIPSY (ESWL);  Surgeon: Abbie Sons, MD;  Location: [facility] ORS;  Service: Urology;  Laterality: Right;  . EYE SURGERY    . WISDOM TOOTH EXTRACTION      Home Medications:  Allergies as of 01/17/2018      Reactions   Sulfa Antibiotics Hives      Medication List        Accurate as of 01/17/18 10:52 AM. Always use your most recent med list.          HYDROcodone-acetaminophen 5-325 MG tablet Commonly known as:  NORCO/VICODIN Take 1 tablet by mouth every 4 (four) hours as needed for moderate pain.   ibuprofen 200 MG tablet Commonly known as:  ADVIL,MOTRIN Take 200 mg by mouth every 6 (six) hours as needed.   tamsulosin 0.4 MG Caps capsule Commonly known as:  FLOMAX Take 1 capsule (0.4 mg total) by mouth daily.       Allergies:  Allergies  Allergen Reactions  . Sulfa Antibiotics Hives    Family History: Family History  Problem Relation Age of Onset  . Alcohol abuse Mother     Social History:  reports that she quit smoking about 2 years ago. She has never used smokeless tobacco. She reports that she drinks alcohol. She reports that she does not use drugs.  ROS: UROLOGY Frequent Urination?: No Hard to postpone urination?: No Burning/pain with urination?: No  Get up at night to urinate?: No Leakage of urine?: No Urine stream starts and stops?: No Trouble starting stream?: No Do you have to strain to urinate?: No Blood in urine?: No Urinary tract infection?: No Sexually transmitted disease?: No Injury to kidneys or bladder?: No Painful intercourse?: No Weak stream?: No Currently pregnant?: No Vaginal bleeding?: No Last menstrual period?: 01/03/18  Gastrointestinal Nausea?: No Vomiting?: No Indigestion/heartburn?: No Diarrhea?: No Constipation?: No  Constitutional Fever: No Night sweats?: No Weight loss?: No Fatigue?:  No  Skin Skin rash/lesions?: No Itching?: No  Eyes Blurred vision?: No Double vision?: No  Ears/Nose/Throat Sore throat?: No Sinus problems?: No  Hematologic/Lymphatic Swollen glands?: No Easy bruising?: No  Cardiovascular Leg swelling?: No Chest pain?: No  Respiratory Cough?: No Shortness of breath?: No  Endocrine Excessive thirst?: No  Musculoskeletal Back pain?: No Joint pain?: No  Neurological Headaches?: No Dizziness?: No  Psychologic Depression?: No Anxiety?: No  Physical Exam: BP 128/75 (BP Location: Left Arm, Patient Position: Sitting, Cuff Size: Normal)   Pulse 70   Ht 5' 6.5" (1.689 m)   Wt 141 lb 8 oz (64.2 kg)   LMP 01/03/2018 (Approximate)   BMI 22.50 kg/m   Constitutional: Well nourished. Alert and oriented, No acute distress. HEENT: [place] AT, moist mucus membranes. Trachea midline, no masses. Cardiovascular: No clubbing, cyanosis, or edema. Respiratory: Normal respiratory effort, no increased work of breathing. GI: Abdomen is soft, non tender, non distended, no abdominal masses. Liver and spleen not palpable.  No hernias appreciated.  Stool sample for occult testing is not indicated.   GU: No CVA tenderness.  No bladder fullness or masses.   Skin: No rashes, bruises or suspicious lesions. Lymph: No cervical or inguinal adenopathy. Neurologic: Grossly intact, no focal deficits, moving all 4 extremities. Psychiatric: Normal mood and affect.  Laboratory Data: Lab Results  Component Value Date   WBC 12.3 (H) 12/18/2017   HGB 13.7 12/18/2017   HCT 40.0 12/18/2017   MCV 90.5 12/18/2017   PLT 257.0 12/18/2017    Lab Results  Component Value Date   CREATININE 0.79 12/18/2017    Results for orders placed or performed during the hospital encounter of 01/03/18  Pregnancy, urine POC  Result Value Ref Range   Preg Test, Ur NEGATIVE NEGATIVE   I have reviewed the labs.  Pertinent Imaging: CLINICAL DATA:  Right-sided kidney  stone.  EXAM: ABDOMEN - 1 VIEW  COMPARISON:  Radiograph of January 03, 2018. CT scan of December 24, 2017.  FINDINGS: The bowel gas pattern is normal. No radio-opaque calculi or other significant radiographic abnormality are seen. Distal right ureteral calculus noted on prior exam is not clearly visualized currently.  IMPRESSION: Distal right ureteral calculus noted on prior exam is not clearly visualized currently. No evidence of bowel obstruction or ileus.   Electronically Signed   By: Marijo Conception, M.D.   On: 01/17/2018 12:12  I have independently reviewed the films  Assessment & Plan:    1. Right ureteral stone Stone sent for analysis Offered 24 hour metabolic work up, but patient declined due to cost  2. Hydronephrosis of right kidney Obtain a RUS to ensure resolution of hydronephrosis  Will call patient with results    Zara Council, [facility]  [place] [address] [place], [place] [address] [phone]

## 2018-01-17 ENCOUNTER — Encounter: Payer: Self-pay | Admitting: Urology

## 2018-01-17 ENCOUNTER — Ambulatory Visit (INDEPENDENT_AMBULATORY_CARE_PROVIDER_SITE_OTHER): Payer: BLUE CROSS/BLUE SHIELD | Admitting: Urology

## 2018-01-17 ENCOUNTER — Ambulatory Visit
Admission: RE | Admit: 2018-01-17 | Discharge: 2018-01-17 | Disposition: A | Discharge status: Home / Self Care | Payer: BLUE CROSS/BLUE SHIELD | Source: Ambulatory Visit | Attending: Urology | Admitting: Urology

## 2018-01-17 VITALS — BP 128/75 | HR 70 | Ht 66.5 in | Wt 141.5 lb

## 2018-01-17 DIAGNOSIS — N201 Calculus of ureter: Secondary | ICD-10-CM

## 2018-01-17 DIAGNOSIS — N200 Calculus of kidney: Secondary | ICD-10-CM | POA: Insufficient documentation

## 2018-01-17 DIAGNOSIS — N1330 Unspecified hydronephrosis: Secondary | ICD-10-CM

## 2018-01-21 DIAGNOSIS — Z3009 Encounter for other general counseling and advice on contraception: Secondary | ICD-10-CM | POA: Diagnosis not present

## 2018-01-24 ENCOUNTER — Other Ambulatory Visit: Payer: Self-pay | Admitting: Urology

## 2018-02-11 ENCOUNTER — Encounter: Payer: BLUE CROSS/BLUE SHIELD | Admitting: Primary Care

## 2018-02-19 ENCOUNTER — Ambulatory Visit
Admission: RE | Admit: 2018-02-19 | Discharge: 2018-02-19 | Disposition: A | Discharge status: Home / Self Care | Payer: BLUE CROSS/BLUE SHIELD | Source: Ambulatory Visit | Attending: Urology | Admitting: Urology

## 2018-02-19 DIAGNOSIS — N1330 Unspecified hydronephrosis: Secondary | ICD-10-CM | POA: Insufficient documentation

## 2018-02-19 DIAGNOSIS — Z87442 Personal history of urinary calculi: Secondary | ICD-10-CM | POA: Diagnosis not present

## 2018-02-19 DIAGNOSIS — N201 Calculus of ureter: Secondary | ICD-10-CM

## 2018-02-19 DIAGNOSIS — N132 Hydronephrosis with renal and ureteral calculous obstruction: Secondary | ICD-10-CM | POA: Diagnosis not present

## 2018-02-20 ENCOUNTER — Telehealth: Payer: Self-pay

## 2018-02-20 ENCOUNTER — Other Ambulatory Visit: Payer: Self-pay | Admitting: Urology

## 2018-02-20 DIAGNOSIS — N1330 Unspecified hydronephrosis: Secondary | ICD-10-CM

## 2018-02-20 NOTE — Telephone Encounter (Signed)
Patient called back.  She was given the information about her stone and her ultrasound.  She expressed understanding.

## 2018-02-20 NOTE — Progress Notes (Signed)
Order for RUS in one month in chart.

## 2018-02-20 NOTE — Telephone Encounter (Signed)
Attempted to reach pt, call went straight to vm and it is full.

## 2018-02-20 NOTE — Telephone Encounter (Signed)
-----   Message from Nori Riis, PA-C sent at 02/20/2018  5:33 AM EDT ----- Please let Jennifer [name] know that her stone was 40% Calcium Oxalate Dihydrate, 25% Calcium Oxalate Monohydrate and 35% Calcium Phosphate Carbonate.  She needs to drink 10 to 12 cups of water daily, increase citrus juices, decrease salt intake and decrease protein intake.  Her ultrasound of her kidneys show that her right kidney is still mildly swollen, so we will need to repeat the ultrasound in one month.

## 2018-03-06 ENCOUNTER — Encounter: Payer: BLUE CROSS/BLUE SHIELD | Admitting: Primary Care

## 2018-03-06 DIAGNOSIS — Z0289 Encounter for other administrative examinations: Secondary | ICD-10-CM

## 2018-03-13 ENCOUNTER — Ambulatory Visit: Payer: BLUE CROSS/BLUE SHIELD

## 2018-03-14 ENCOUNTER — Other Ambulatory Visit: Payer: Self-pay | Admitting: Primary Care

## 2018-03-25 ENCOUNTER — Encounter: Payer: Self-pay | Admitting: Primary Care

## 2018-03-25 ENCOUNTER — Ambulatory Visit: Payer: BLUE CROSS/BLUE SHIELD | Admitting: Primary Care

## 2018-03-25 DIAGNOSIS — N926 Irregular menstruation, unspecified: Secondary | ICD-10-CM

## 2018-03-25 DIAGNOSIS — R5383 Other fatigue: Secondary | ICD-10-CM | POA: Insufficient documentation

## 2018-03-25 DIAGNOSIS — N200 Calculus of kidney: Secondary | ICD-10-CM | POA: Diagnosis not present

## 2018-03-25 LAB — BASIC METABOLIC PANEL
BUN: 10 mg/dL (ref 6–23)
CO2: 27 mEq/L (ref 19–32)
Calcium: 9.6 mg/dL (ref 8.4–10.5)
Chloride: 105 mEq/L (ref 96–112)
Creatinine, Ser: 0.72 mg/dL (ref 0.40–1.20)
GFR: 106.08 mL/min (ref >60.00)
Glucose, Bld: 69 mg/dL — ABNORMAL LOW (ref 70–99)
Potassium: 3.9 mEq/L (ref 3.5–5.1)
Sodium: 140 mEq/L (ref 135–145)

## 2018-03-25 LAB — CBC
HCT: 41.9 % (ref 36.0–46.0)
Hemoglobin: 14.4 g/dL (ref 12.0–15.0)
MCHC: 34.4 g/dL (ref 30.0–36.0)
MCV: 88.6 fl (ref 78.0–100.0)
Platelets: 252 10*3/uL (ref 150.0–400.0)
RBC: 4.73 Mil/uL (ref 3.87–5.11)
RDW: 13 % (ref 11.5–15.5)
WBC: 6.2 10*3/uL (ref 4.0–10.5)

## 2018-03-25 LAB — VITAMIN D 25 HYDROXY (VIT D DEFICIENCY, FRACTURES): VITD: 30.97 ng/mL (ref 30.00–100.00)

## 2018-03-25 LAB — VITAMIN B12: Vitamin B-12: 472 pg/mL (ref 211–911)

## 2018-03-25 LAB — TSH: TSH: 2.22 u[IU]/mL (ref 0.35–4.50)

## 2018-03-25 NOTE — Progress Notes (Signed)
Subjective:    Patient ID: Jennifer [name], female    DOB: 04-15-1996, 22 y.o.   MRN: [id number]  HPI  Jennifer [name] is a 22 year old female who presents today to transfer care from Dr. Deborra Medina. She'd also like to discuss fatigue.   1) Fatigue: Chronic for past one year. Reports that she's always feeling "tired" despite an uninterrupted full night of sleep. She feels tired during the day and would nap if she has time. She endorses a steady weight, steady history of large amounts of hair shedding which has not changed. She does have a heavy menstrual cycle for 2 days, then decreased amount of flow. She is under a lot of stress and is busy with both work and school. She denies palpitations, cold/heat intolerance.    Review of Systems  Constitutional: Positive for fatigue.  Respiratory: Negative for shortness of breath.   Cardiovascular: Negative for chest pain and palpitations.  Endocrine: Negative for cold intolerance and heat intolerance.  Neurological: Negative for dizziness.       Past Medical History:  Diagnosis Date  . Irregular periods   . Strabismus      Social History   Socioeconomic History  . Marital status: Single    Spouse name: Not on file  . Number of children: Not on file  . Years of education: Not on file  . Highest education level: Not on file  Occupational History  . Not on file  Social Needs  . Financial resource strain: Not on file  . Food insecurity:    Worry: Not on file    Inability: Not on file  . Transportation needs:    Medical: Not on file    Non-medical: Not on file  Tobacco Use  . Smoking status: Former Smoker    Last attempt to quit: 03/20/2015    Years since quitting: 3.0  . Smokeless tobacco: Never Used  Substance and Sexual Activity  . Alcohol use: Yes    Alcohol/week: 0.0 standard drinks    Comment: occ  . Drug use: No  . Sexual activity: Yes  Lifestyle  . Physical activity:    Days per week: Not on file    Minutes per  session: Not on file  . Stress: Not on file  Relationships  . Social connections:    Talks on phone: Not on file    Gets together: Not on file    Attends religious service: Not on file    Active member of club or organization: Not on file    Attends meetings of clubs or organizations: Not on file    Relationship status: Not on file  . Intimate partner violence:    Fear of current or ex partner: Not on file    Emotionally abused: Not on file    Physically abused: Not on file    Forced sexual activity: Not on file  Other Topics Concern  . Not on file  Social History Narrative   Attending [employer] in the fall.   Sexually active, uses condoms.   Lives with dad.   Mom is recovering alcoholic.   Has good support system- both grandmothers live near by.      Past Surgical History:  Procedure Laterality Date  . EXTRACORPOREAL SHOCK WAVE LITHOTRIPSY Right 01/03/2018   Procedure: EXTRACORPOREAL SHOCK WAVE LITHOTRIPSY (ESWL);  Surgeon: Abbie Sons, MD;  Location: [facility] ORS;  Service: Urology;  Laterality: Right;  . EYE SURGERY    . WISDOM TOOTH  EXTRACTION      Family History  Problem Relation Age of Onset  . Alcohol abuse Mother     Allergies  Allergen Reactions  . Sulfa Antibiotics Hives    No current outpatient medications on file prior to visit.   No current facility-administered medications on file prior to visit.     BP 116/78   Pulse 82   Temp 98.2 F (36.8 C) (Oral)   Ht 5' 6.5" (1.689 m)   Wt 149 lb (67.6 kg)   LMP 03/05/2018   SpO2 99%   BMI 23.69 kg/m    Objective:   Physical Exam  Constitutional: She appears well-nourished.  Neck: Neck supple.  Cardiovascular: Normal rate and regular rhythm.  Respiratory: Effort normal and breath sounds normal.  Skin: Skin is warm and dry.  Psychiatric: She has a normal mood and affect.           Assessment & Plan:

## 2018-03-25 NOTE — Assessment & Plan Note (Signed)
Following with Urology and is due for repeat Ultrasound and is working on scheduling.

## 2018-03-25 NOTE — Assessment & Plan Note (Signed)
Chronic for the last one year, is under a lot of stress with school and work. Check labs today including TSH, B12, vitamin D, CBC, BMP. Discussed to work on work/school life balance, get plenty of rest, regular exercise. Await labs.

## 2018-03-25 NOTE — Assessment & Plan Note (Signed)
Regular menstrual cycles since coming off of birth control. Continue to monitor.

## 2018-03-25 NOTE — Addendum Note (Signed)
Addended by: Pleas Koch on: 03/25/2018 11:55 AM   Modules accepted: Orders

## 2018-03-25 NOTE — Patient Instructions (Signed)
Stop by the lab prior to leaving today. I will notify you of your results once received.   Start exercising. You should be getting 150 minutes of moderate intensity exercise weekly.  It's important to improve your diet by reducing consumption of fast food, fried food, processed snack foods, sugary drinks. Increase consumption of fresh vegetables and fruits, whole grains, water.  Ensure you are drinking 64 ounces of water daily.  Make sure to reduce stress by spending time on yourself away from school and work.  It was a pleasure to see you today!

## 2018-04-01 ENCOUNTER — Ambulatory Visit
Admission: RE | Admit: 2018-04-01 | Discharge: 2018-04-01 | Disposition: A | Discharge status: Home / Self Care | Payer: BLUE CROSS/BLUE SHIELD | Source: Ambulatory Visit | Attending: Urology | Admitting: Urology

## 2018-04-01 DIAGNOSIS — N1330 Unspecified hydronephrosis: Secondary | ICD-10-CM | POA: Diagnosis not present

## 2018-04-01 DIAGNOSIS — N1339 Other hydronephrosis: Secondary | ICD-10-CM | POA: Diagnosis not present

## 2018-04-05 ENCOUNTER — Telehealth: Payer: Self-pay | Admitting: Family Medicine

## 2018-04-05 NOTE — Telephone Encounter (Signed)
-----   Message from Nori Riis, PA-C sent at 04/05/2018  7:56 AM EDT ----- Please let Jennifer [name] know that her renal ultrasound was normal.

## 2018-04-05 NOTE — Telephone Encounter (Signed)
Patient notified and voiced understanding.

## 2018-06-20 ENCOUNTER — Ambulatory Visit
Admission: RE | Admit: 2018-06-20 | Discharge: 2018-06-20 | Disposition: A | Discharge status: Home / Self Care | Payer: BLUE CROSS/BLUE SHIELD | Source: Ambulatory Visit | Attending: Primary Care | Admitting: Primary Care

## 2018-06-20 ENCOUNTER — Encounter: Payer: Self-pay | Admitting: Primary Care

## 2018-06-20 ENCOUNTER — Other Ambulatory Visit: Payer: Self-pay | Admitting: Primary Care

## 2018-06-20 ENCOUNTER — Telehealth: Payer: Self-pay | Admitting: Primary Care

## 2018-06-20 ENCOUNTER — Ambulatory Visit: Payer: BLUE CROSS/BLUE SHIELD | Admitting: Primary Care

## 2018-06-20 DIAGNOSIS — R238 Other skin changes: Secondary | ICD-10-CM

## 2018-06-20 DIAGNOSIS — N6489 Other specified disorders of breast: Secondary | ICD-10-CM | POA: Diagnosis not present

## 2018-06-20 NOTE — Assessment & Plan Note (Signed)
To left nipple for the last one week. Exam today with mild irritation to left nipple, could be from irritation from nipple ring or underlying abscess. Will rule out abscess with ultrasound of breast.  She appears stable, no obvious abscess at this point. Discussed to continue without her nipple ring.  Keep dermatology appointment as scheduled.

## 2018-06-20 NOTE — Patient Instructions (Signed)
Stop by the front desk and speak with Shirlean Mylar regarding your ultrasound.  Keep your appointment with the dermatologist.  You can help to reduce the swelling with ice and medications like Ibuprofen/naproxen.   I will be in touch as soon as I receive the results.   It was a pleasure to see you today!

## 2018-06-20 NOTE — Progress Notes (Signed)
Subjective:    Patient ID: Jennifer [name], female    DOB: 27-Jun-1995, 23 y.o.   MRN: [id number]  HPI  Jennifer [name] is a 23 year old female who presents today with a chief complaint of nipple mass.  She's noticed enlargement, erythema, and discomfort to the nipple on her left breast. She noticed this earlier this week. She also has a history of nipple piercing years ago, noticed puss around the piercing earlier this week so she removed the nipple ring.  She has a history of a cyst to the left breast around her nipple several years ago. Since then she's had "pimples" develop to the site which would improve with Rx cream. She was seeing dermatology at the time.  She denies fevers, family history of breast cancer, breast masses. She does have an appointment with dermatology scheduled for March.   Review of Systems  Constitutional: Negative for fever.  Skin: Positive for color change. Negative for rash.       Past Medical History:  Diagnosis Date  . Irregular periods   . Strabismus      Social History   Socioeconomic History  . Marital status: Single    Spouse name: Not on file  . Number of children: Not on file  . Years of education: Not on file  . Highest education level: Not on file  Occupational History  . Not on file  Social Needs  . Financial resource strain: Not on file  . Food insecurity:    Worry: Not on file    Inability: Not on file  . Transportation needs:    Medical: Not on file    Non-medical: Not on file  Tobacco Use  . Smoking status: Former Smoker    Last attempt to quit: 03/20/2015    Years since quitting: 3.2  . Smokeless tobacco: Never Used  Substance and Sexual Activity  . Alcohol use: Yes    Alcohol/week: 0.0 standard drinks    Comment: occ  . Drug use: No  . Sexual activity: Yes  Lifestyle  . Physical activity:    Days per week: Not on file    Minutes per session: Not on file  . Stress: Not on file  Relationships  . Social  connections:    Talks on phone: Not on file    Gets together: Not on file    Attends religious service: Not on file    Active member of club or organization: Not on file    Attends meetings of clubs or organizations: Not on file    Relationship status: Not on file  . Intimate partner violence:    Fear of current or ex partner: Not on file    Emotionally abused: Not on file    Physically abused: Not on file    Forced sexual activity: Not on file  Other Topics Concern  . Not on file  Social History Narrative   Attending [employer] in the fall.   Sexually active, uses condoms.   Lives with dad.   Mom is recovering alcoholic.   Has good support system- both grandmothers live near by.      Past Surgical History:  Procedure Laterality Date  . EXTRACORPOREAL SHOCK WAVE LITHOTRIPSY Right 01/03/2018   Procedure: EXTRACORPOREAL SHOCK WAVE LITHOTRIPSY (ESWL);  Surgeon: Abbie Sons, MD;  Location: [facility] ORS;  Service: Urology;  Laterality: Right;  . EYE SURGERY    . WISDOM TOOTH EXTRACTION      Family History  Problem Relation Age of Onset  . Alcohol abuse Mother     Allergies  Allergen Reactions  . Sulfa Antibiotics Hives    No current outpatient medications on file prior to visit.   No current facility-administered medications on file prior to visit.     BP 98/62 (BP Location: Left Arm, Patient Position: Sitting, Cuff Size: Normal)   Pulse 69   Temp 98.2 F (36.8 C) (Oral)   Ht 5' 6.5" (1.689 m)   Wt 155 lb 12 oz (70.6 kg)   LMP 06/19/2018   SpO2 99%   BMI 24.76 kg/m    Objective:   Physical Exam  Constitutional: She appears well-nourished.  Cardiovascular: Normal rate.  Respiratory: Effort normal. Left breast exhibits tenderness. Left breast exhibits no nipple discharge.    Mild erythema with mild swelling to medial side of left nipple. No drainage. Mild tenderness. No surrounding erythema.  Skin: Skin is warm and dry. There is erythema.           Assessment  & Plan:

## 2018-06-20 NOTE — Telephone Encounter (Signed)
Pt has appointment today @ [facility].  They changed orders and sent back to you to sign.

## 2018-06-20 NOTE — Telephone Encounter (Signed)
Completed.

## 2018-06-21 ENCOUNTER — Telehealth: Payer: Self-pay | Admitting: Primary Care

## 2018-06-21 ENCOUNTER — Other Ambulatory Visit: Payer: Self-pay | Admitting: Primary Care

## 2018-06-21 DIAGNOSIS — L03313 Cellulitis of chest wall: Secondary | ICD-10-CM

## 2018-06-21 MED ORDER — CEPHALEXIN 500 MG PO CAPS: 500.0000 mg | ORAL_CAPSULE | Freq: Three times a day (TID) | ORAL | 0 refills | Status: AC

## 2018-06-21 NOTE — Telephone Encounter (Signed)
Lvm asking patient to call office. °

## 2018-06-21 NOTE — Telephone Encounter (Signed)
Will you notify patient that I want to see her back in the office in one week for follow up of her recent visit? Please schedule.

## 2018-07-01 NOTE — Telephone Encounter (Signed)
lvm asking pt to call office °

## 2018-07-02 ENCOUNTER — Ambulatory Visit: Payer: BLUE CROSS/BLUE SHIELD | Admitting: Primary Care

## 2018-08-15 DIAGNOSIS — A5600 Chlamydial infection of lower genitourinary tract, unspecified: Secondary | ICD-10-CM | POA: Diagnosis not present

## 2018-10-16 DIAGNOSIS — H10413 Chronic giant papillary conjunctivitis, bilateral: Secondary | ICD-10-CM | POA: Diagnosis not present

## 2018-11-04 ENCOUNTER — Encounter: Payer: Self-pay | Admitting: Primary Care

## 2018-11-04 ENCOUNTER — Ambulatory Visit: Payer: BLUE CROSS/BLUE SHIELD | Admitting: Primary Care

## 2018-11-04 ENCOUNTER — Other Ambulatory Visit: Payer: Self-pay

## 2018-11-04 VITALS — BP 104/64 | HR 85 | Temp 98.1°F | Ht 66.5 in | Wt 165.2 lb

## 2018-11-04 DIAGNOSIS — Z202 Contact with and (suspected) exposure to infections with a predominantly sexual mode of transmission: Secondary | ICD-10-CM

## 2018-11-04 DIAGNOSIS — N898 Other specified noninflammatory disorders of vagina: Secondary | ICD-10-CM | POA: Insufficient documentation

## 2018-11-04 MED ORDER — AZITHROMYCIN 250 MG PO TABS: ORAL_TABLET | ORAL | 0 refills | Status: DC

## 2018-11-04 NOTE — Progress Notes (Signed)
Subjective:    Patient ID: Jennifer [name], female    DOB: July 24, 1995, 23 y.o.   MRN: [id number]  HPI  Jennifer [name] is a 23 year old female who presents today for STD testing.   She was exposed to chlamydia through un-protected intercourse one week ago. She was called by her female partner four days ago and he endorsed that he tested positive.   She's experienced symptoms of vaginal itching and discharge that began one week ago. She denies fevers, abdominal pain, pelvic pain, vaginal bleeding.  Review of Systems  Constitutional: Negative for fever.  Gastrointestinal: Negative for abdominal pain.  Genitourinary: Positive for vaginal discharge. Negative for dysuria, frequency, pelvic pain and vaginal bleeding.       Vaginal itching       Past Medical History:  Diagnosis Date  . Irregular periods   . Strabismus      Social History   Socioeconomic History  . Marital status: Single    Spouse name: Not on file  . Number of children: Not on file  . Years of education: Not on file  . Highest education level: Not on file  Occupational History  . Not on file  Social Needs  . Financial resource strain: Not on file  . Food insecurity:    Worry: Not on file    Inability: Not on file  . Transportation needs:    Medical: Not on file    Non-medical: Not on file  Tobacco Use  . Smoking status: Former Smoker    Last attempt to quit: 03/20/2015    Years since quitting: 3.6  . Smokeless tobacco: Never Used  Substance and Sexual Activity  . Alcohol use: Yes    Alcohol/week: 0.0 standard drinks    Comment: occ  . Drug use: No  . Sexual activity: Yes  Lifestyle  . Physical activity:    Days per week: Not on file    Minutes per session: Not on file  . Stress: Not on file  Relationships  . Social connections:    Talks on phone: Not on file    Gets together: Not on file    Attends religious service: Not on file    Active member of club or organization: Not on file   Attends meetings of clubs or organizations: Not on file    Relationship status: Not on file  . Intimate partner violence:    Fear of current or ex partner: Not on file    Emotionally abused: Not on file    Physically abused: Not on file    Forced sexual activity: Not on file  Other Topics Concern  . Not on file  Social History Narrative   Attending [employer] in the fall.   Sexually active, uses condoms.   Lives with dad.   Mom is recovering alcoholic.   Has good support system- both grandmothers live near by.      Past Surgical History:  Procedure Laterality Date  . EXTRACORPOREAL SHOCK WAVE LITHOTRIPSY Right 01/03/2018   Procedure: EXTRACORPOREAL SHOCK WAVE LITHOTRIPSY (ESWL);  Surgeon: Abbie Sons, MD;  Location: [facility] ORS;  Service: Urology;  Laterality: Right;  . EYE SURGERY    . WISDOM TOOTH EXTRACTION      Family History  Problem Relation Age of Onset  . Alcohol abuse Mother     Allergies  Allergen Reactions  . Sulfa Antibiotics Hives    No current outpatient medications on file prior to visit.   No  current facility-administered medications on file prior to visit.     BP 104/64   Pulse 85   Temp 98.1 F (36.7 C) (Oral)   Ht 5' 6.5" (1.689 m)   Wt 165 lb 4 oz (75 kg)   LMP 10/06/2018   SpO2 98%   BMI 26.27 kg/m    Objective:   Physical Exam  Constitutional: She is oriented to person, place, and time. She appears well-nourished.  Genitourinary: There is no lesion on the right labia. There is no lesion on the left labia. Cervix exhibits discharge. Cervix exhibits no motion tenderness.    Vaginal discharge present.     No vaginal erythema.  No erythema in the vagina.    Genitourinary Comments: Moderate amount of whitish/light green vaginal discharge    Neurological: She is alert and oriented to person, place, and time.           Assessment & Plan:

## 2018-11-04 NOTE — Patient Instructions (Signed)
Take the all four of the azithromycin antibiotics once.   I will be in touch soon with your results.  It was a pleasure to see you today!

## 2018-11-04 NOTE — Assessment & Plan Note (Signed)
Exposed to chlamydia, now with symptoms. Exam consistent for STD. Specimen collected for Wet Prep, gonorrhea, chlamydia. Treated prophylactic for chlamydia given positive exposure and exam. Rx for Azithromycin 1 gm sent to pharmacy. Discussed the importance of protection during intercourse.

## 2018-11-05 LAB — WET PREP BY MOLECULAR PROBE
Candida species: NOT DETECTED
MICRO NUMBER:: 523891
SPECIMEN QUALITY:: ADEQUATE
Trichomonas vaginosis: NOT DETECTED

## 2018-11-05 LAB — C. TRACHOMATIS/N. GONORRHOEAE RNA
C. trachomatis RNA, TMA: DETECTED — AB
N. gonorrhoeae RNA, TMA: NOT DETECTED

## 2018-11-27 ENCOUNTER — Encounter (HOSPITAL_COMMUNITY): Payer: Self-pay | Admitting: Emergency Medicine

## 2018-11-27 ENCOUNTER — Ambulatory Visit (INDEPENDENT_AMBULATORY_CARE_PROVIDER_SITE_OTHER): Payer: BC Managed Care – PPO | Admitting: Primary Care

## 2018-11-27 ENCOUNTER — Encounter: Payer: Self-pay | Admitting: Primary Care

## 2018-11-27 ENCOUNTER — Other Ambulatory Visit: Payer: Self-pay

## 2018-11-27 VITALS — BP 112/72 | HR 70 | Temp 98.2°F | Ht 66.5 in | Wt 166.5 lb

## 2018-11-27 DIAGNOSIS — R1031 Right lower quadrant pain: Secondary | ICD-10-CM | POA: Diagnosis not present

## 2018-11-27 DIAGNOSIS — N12 Tubulo-interstitial nephritis, not specified as acute or chronic: Secondary | ICD-10-CM | POA: Diagnosis not present

## 2018-11-27 DIAGNOSIS — N760 Acute vaginitis: Secondary | ICD-10-CM | POA: Diagnosis not present

## 2018-11-27 DIAGNOSIS — N3289 Other specified disorders of bladder: Secondary | ICD-10-CM | POA: Diagnosis not present

## 2018-11-27 DIAGNOSIS — Z87891 Personal history of nicotine dependence: Secondary | ICD-10-CM | POA: Insufficient documentation

## 2018-11-27 DIAGNOSIS — N898 Other specified noninflammatory disorders of vagina: Secondary | ICD-10-CM | POA: Diagnosis not present

## 2018-11-27 DIAGNOSIS — R109 Unspecified abdominal pain: Secondary | ICD-10-CM

## 2018-11-27 DIAGNOSIS — N10 Acute tubulo-interstitial nephritis: Secondary | ICD-10-CM | POA: Insufficient documentation

## 2018-11-27 LAB — POC URINALSYSI DIPSTICK (AUTOMATED)
Bilirubin, UA: NEGATIVE
Blood, UA: NEGATIVE
Glucose, UA: NEGATIVE
Ketones, UA: NEGATIVE
Leukocytes, UA: NEGATIVE
Nitrite, UA: NEGATIVE
Protein, UA: NEGATIVE
Spec Grav, UA: 1.015 (ref 1.010–1.025)
Urobilinogen, UA: 0.2 E.U./dL
pH, UA: 6 (ref 5.0–8.0)

## 2018-11-27 NOTE — Progress Notes (Signed)
Subjective:    Patient ID: Jennifer [name], female    DOB: 11-Oct-1995, 23 y.o.   MRN: [id number]  HPI  Jennifer [name] is a 23 year old female with a history of chlamydia, bacterial vaginosis, renal stones who presents today with a chief complaint of back pain and vaginal discharge.  Her pain is located to the right flank which she noticed last night before bed. She took Ibuprofen and fell asleep. Her pain woke her from sleep at 5:30 am this morning so she took some Ibuprofen with temporary improvement. She's since taken Tylenol 1000 mg at 1 pm today with temporary improvement.  Her pain has not radiated. She denies hematuria, dysuria, urinary frequency, fevers, abdominal pain. She has been more working more but doesn't recall heavy lifting or strenuous activity. She wants to ensure she's not passing another kidney stone.  She was treated for chlamydia in early June 2020, but has continued to notice yellowish vaginal discharge, some itching. Her vaginal odor and pelvic discomfort have resolved since chlamydia treatment. She took her Azithromycin as prescribed.  Review of Systems  Constitutional: Negative for fever.  Gastrointestinal: Negative for abdominal pain.  Genitourinary: Positive for flank pain and vaginal discharge. Negative for dysuria, frequency and hematuria.       Past Medical History:  Diagnosis Date  . Irregular periods   . Strabismus      Social History   Socioeconomic History  . Marital status: Single    Spouse name: Not on file  . Number of children: Not on file  . Years of education: Not on file  . Highest education level: Not on file  Occupational History  . Not on file  Social Needs  . Financial resource strain: Not on file  . Food insecurity    Worry: Not on file    Inability: Not on file  . Transportation needs    Medical: Not on file    Non-medical: Not on file  Tobacco Use  . Smoking status: Former Smoker    Quit date: 03/20/2015    Years  since quitting: 3.6  . Smokeless tobacco: Never Used  Substance and Sexual Activity  . Alcohol use: Yes    Alcohol/week: 0.0 standard drinks    Comment: occ  . Drug use: No  . Sexual activity: Yes  Lifestyle  . Physical activity    Days per week: Not on file    Minutes per session: Not on file  . Stress: Not on file  Relationships  . Social Herbalist on phone: Not on file    Gets together: Not on file    Attends religious service: Not on file    Active member of club or organization: Not on file    Attends meetings of clubs or organizations: Not on file    Relationship status: Not on file  . Intimate partner violence    Fear of current or ex partner: Not on file    Emotionally abused: Not on file    Physically abused: Not on file    Forced sexual activity: Not on file  Other Topics Concern  . Not on file  Social History Narrative   Attending [employer] in the fall.   Sexually active, uses condoms.   Lives with dad.   Mom is recovering alcoholic.   Has good support system- both grandmothers live near by.      Past Surgical History:  Procedure Laterality Date  . EXTRACORPOREAL SHOCK WAVE  LITHOTRIPSY Right 01/03/2018   Procedure: EXTRACORPOREAL SHOCK WAVE LITHOTRIPSY (ESWL);  Surgeon: Abbie Sons, MD;  Location: [facility] ORS;  Service: Urology;  Laterality: Right;  . EYE SURGERY    . WISDOM TOOTH EXTRACTION      Family History  Problem Relation Age of Onset  . Alcohol abuse Mother     Allergies  Allergen Reactions  . Sulfa Antibiotics Hives    No current outpatient medications on file prior to visit.   No current facility-administered medications on file prior to visit.     BP 112/72   Pulse 70   Temp 98.2 F (36.8 C) (Tympanic)   Ht 5' 6.5" (1.689 m)   Wt 166 lb 8 oz (75.5 kg)   LMP 10/06/2018   SpO2 98%   BMI 26.47 kg/m    Objective:   Physical Exam  Constitutional: She appears well-nourished.  Neck: Neck supple.  Cardiovascular: Normal  rate.  Respiratory: Effort normal.  GI: Soft. Bowel sounds are normal. There is no abdominal tenderness. There is no CVA tenderness.  Genitourinary: There is no tenderness or lesion on the right labia. There is no tenderness or lesion on the left labia. Cervix exhibits discharge. Cervix exhibits no motion tenderness.    Vaginal discharge present.     No vaginal erythema.  No erythema in the vagina.    Genitourinary Comments: Mild amount of whitish vaginal discharge, no foul smell.   Skin: Skin is warm and dry.  Psychiatric: She has a normal mood and affect.           Assessment & Plan:

## 2018-11-27 NOTE — ED Triage Notes (Signed)
Patient here from with complaints of right flank pain that started last night. Reports hx of kidney stones. Denies urinary symptoms.

## 2018-11-27 NOTE — Patient Instructions (Signed)
You will be contacted regarding your ultrasound.  Please let us know if you have not been contacted within one week.   I will be in touch with your vaginal swabs once received.   It was a pleasure to see you today!

## 2018-11-27 NOTE — Assessment & Plan Note (Signed)
Continued since treatment for chlamydia. Could be residual BV which was noted on her wet prep from last visit.  Repeat chlamydia/gonorrhea testing. Repeat wet prep for BV/yeast.  Consider BV treatment if needed.

## 2018-11-27 NOTE — Assessment & Plan Note (Signed)
History of renal stone to right side, symptoms began last night. Overall she appears stable and is in no distress.  UA today is clear. Renal ultrasound pending.

## 2018-11-28 ENCOUNTER — Emergency Department (HOSPITAL_COMMUNITY)
Admission: EM | Admit: 2018-11-28 | Discharge: 2018-11-28 | Disposition: A | Discharge status: Home / Self Care | Payer: BC Managed Care – PPO | Attending: Emergency Medicine | Admitting: Emergency Medicine

## 2018-11-28 ENCOUNTER — Emergency Department (HOSPITAL_COMMUNITY): Payer: BC Managed Care – PPO

## 2018-11-28 ENCOUNTER — Ambulatory Visit: Payer: BLUE CROSS/BLUE SHIELD | Admitting: Family Medicine

## 2018-11-28 ENCOUNTER — Other Ambulatory Visit: Payer: Self-pay | Admitting: Primary Care

## 2018-11-28 DIAGNOSIS — B9689 Other specified bacterial agents as the cause of diseases classified elsewhere: Secondary | ICD-10-CM

## 2018-11-28 DIAGNOSIS — N3289 Other specified disorders of bladder: Secondary | ICD-10-CM | POA: Diagnosis not present

## 2018-11-28 DIAGNOSIS — N12 Tubulo-interstitial nephritis, not specified as acute or chronic: Secondary | ICD-10-CM

## 2018-11-28 LAB — CBC WITH DIFFERENTIAL/PLATELET
Abs Immature Granulocytes: 0.04 10*3/uL (ref 0.00–0.07)
Basophils Absolute: 0.1 10*3/uL (ref 0.0–0.1)
Basophils Relative: 0 %
Eosinophils Absolute: 0.2 10*3/uL (ref 0.0–0.5)
Eosinophils Relative: 2 %
HCT: 41.5 % (ref 36.0–46.0)
Hemoglobin: 14 g/dL (ref 12.0–15.0)
Immature Granulocytes: 0 %
Lymphocytes Relative: 20 %
Lymphs Abs: 2.8 10*3/uL (ref 0.7–4.0)
MCH: 31 pg (ref 26.0–34.0)
MCHC: 33.7 g/dL (ref 30.0–36.0)
MCV: 91.8 fL (ref 80.0–100.0)
Monocytes Absolute: 1 10*3/uL (ref 0.1–1.0)
Monocytes Relative: 7 %
Neutro Abs: 10.3 10*3/uL — ABNORMAL HIGH (ref 1.7–7.7)
Neutrophils Relative %: 71 %
Platelets: 231 10*3/uL (ref 150–400)
RBC: 4.52 MIL/uL (ref 3.87–5.11)
RDW: 12.2 % (ref 11.5–15.5)
WBC: 14.3 10*3/uL — ABNORMAL HIGH (ref 4.0–10.5)
nRBC: 0 % (ref 0.0–0.2)

## 2018-11-28 LAB — BASIC METABOLIC PANEL
Anion gap: 10 (ref 5–15)
BUN: 16 mg/dL (ref 6–20)
CO2: 25 mmol/L (ref 22–32)
Calcium: 9.4 mg/dL (ref 8.9–10.3)
Chloride: 104 mmol/L (ref 98–111)
Creatinine, Ser: 0.67 mg/dL (ref 0.44–1.00)
GFR calc Af Amer: >60 mL/min (ref >60)
GFR calc non Af Amer: >60 mL/min (ref >60)
Glucose, Bld: 95 mg/dL (ref 70–99)
Potassium: 3.8 mmol/L (ref 3.5–5.1)
Sodium: 139 mmol/L (ref 135–145)

## 2018-11-28 LAB — URINALYSIS, ROUTINE W REFLEX MICROSCOPIC
Bilirubin Urine: NEGATIVE
Glucose, UA: NEGATIVE mg/dL
Ketones, ur: 20 mg/dL — AB
Nitrite: NEGATIVE
Protein, ur: NEGATIVE mg/dL
Specific Gravity, Urine: 1.01 (ref 1.005–1.030)
pH: 6 (ref 5.0–8.0)

## 2018-11-28 LAB — C. TRACHOMATIS/N. GONORRHOEAE RNA
C. trachomatis RNA, TMA: NOT DETECTED
N. gonorrhoeae RNA, TMA: NOT DETECTED

## 2018-11-28 LAB — I-STAT BETA HCG BLOOD, ED (MC, WL, AP ONLY): I-stat hCG, quantitative: <5 m[IU]/mL (ref <5)

## 2018-11-28 LAB — WET PREP BY MOLECULAR PROBE
Candida species: NOT DETECTED
MICRO NUMBER:: 603178
SPECIMEN QUALITY:: ADEQUATE
Trichomonas vaginosis: NOT DETECTED

## 2018-11-28 MED ORDER — METRONIDAZOLE 0.75 % VA GEL: 1.0000 | Freq: Two times a day (BID) | VAGINAL | 0 refills | Status: DC

## 2018-11-28 MED ORDER — OXYCODONE-ACETAMINOPHEN 5-325 MG PO TABS
1.0000 | ORAL_TABLET | Freq: Once | ORAL | Status: DC
  Filled 2018-11-28: qty 1

## 2018-11-28 MED ORDER — HYDROCODONE-ACETAMINOPHEN 5-325 MG PO TABS: 1.0000 | ORAL_TABLET | ORAL | 0 refills | Status: DC | PRN

## 2018-11-28 MED ORDER — CEPHALEXIN 500 MG PO CAPS: 500.0000 mg | ORAL_CAPSULE | Freq: Two times a day (BID) | ORAL | 0 refills | Status: DC

## 2018-11-28 NOTE — Discharge Instructions (Signed)
Take the prescribed medication as directed.  Make sure to drink plenty of water. Follow-up with your primary care doctor.  If not improving after antibiotics, follow-up with urology. Return to the ED for new or worsening symptoms-- high fever, uncontrolled vomiting, etc.

## 2018-11-28 NOTE — ED Provider Notes (Signed)
[facility] DEPT Provider Note   CSN: [id number] Arrival date & time: 11/27/18  2313     History   Chief Complaint Chief Complaint  Patient presents with  . Flank Pain    HPI Jennifer [name] is a 23 y.o. female.     The history is provided by medical records and the patient.  Flank Pain     23 year old female with history of kidney stones, presenting to the ED for right flank pain.  She reports this began last evening.  Pain localized to right flank without significant radiation.  She has had a little bit of nausea but denies vomiting.  She is still able to eat and drink fine.  She is not had any fever.  She does report frequent urination but states this is ongoing.  She denies any dysuria or noted hematuria.  She has not had any pelvic pain or vaginal discharge.  Has history of kidney stone last summer that required lithotripsy, states this feels similar.  She has been taking Tylenol and Motrin at home for pain without significant relief.  Past Medical History:  Diagnosis Date  . Irregular periods   . Kidney stone on right side 12/24/2017  . Strabismus     Patient Active Problem List   Diagnosis Date Noted  . Vaginal discharge 11/04/2018  . Skin irritation 06/20/2018  . Fatigue 03/25/2018  . Screening for STD (sexually transmitted disease) 12/13/2015  . Flank pain 08/29/2013  . Irregular periods 10/11/2010    Past Surgical History:  Procedure Laterality Date  . EXTRACORPOREAL SHOCK WAVE LITHOTRIPSY Right 01/03/2018   Procedure: EXTRACORPOREAL SHOCK WAVE LITHOTRIPSY (ESWL);  Surgeon: Abbie Sons, MD;  Location: [facility] ORS;  Service: Urology;  Laterality: Right;  . EYE SURGERY    . WISDOM TOOTH EXTRACTION       OB History   No obstetric history on file.      Home Medications    Prior to Admission medications   Medication Sig Start Date End Date Taking? Authorizing Provider  acetaminophen (TYLENOL) 500 MG tablet Take  1,000 mg by mouth every 6 (six) hours as needed for moderate pain.   Yes [provider]  ibuprofen (ADVIL) 200 MG tablet Take 600 mg by mouth every 6 (six) hours as needed for moderate pain.   Yes [provider]    Family History Family History  Problem Relation Age of Onset  . Alcohol abuse Mother     Social History Social History   Tobacco Use  . Smoking status: Former Smoker    Quit date: 03/20/2015    Years since quitting: 3.6  . Smokeless tobacco: Never Used  Substance Use Topics  . Alcohol use: Yes    Alcohol/week: 0.0 standard drinks    Comment: occ  . Drug use: No     Allergies   Sulfa antibiotics   Review of Systems Review of Systems  Genitourinary: Positive for flank pain.  All other systems reviewed and are negative.    Physical Exam Updated Vital Signs BP 130/78 (BP Location: Left Arm)   Pulse 72   Temp 98.9 F (37.2 C) (Oral)   Resp 19   SpO2 99%   Physical Exam Vitals signs and nursing note reviewed.  Constitutional:      Appearance: She is well-developed.  HENT:     Head: Normocephalic and atraumatic.  Eyes:     Conjunctiva/sclera: Conjunctivae normal.     Pupils: Pupils are equal, round,  and reactive to light.  Neck:     Musculoskeletal: Normal range of motion.  Cardiovascular:     Rate and Rhythm: Normal rate and regular rhythm.     Heart sounds: Normal heart sounds.  Pulmonary:     Effort: Pulmonary effort is normal.     Breath sounds: Normal breath sounds.  Abdominal:     General: Bowel sounds are normal.     Palpations: Abdomen is soft.     Tenderness: There is right CVA tenderness.  Musculoskeletal: Normal range of motion.  Skin:    General: Skin is warm and dry.  Neurological:     Mental Status: She is alert and oriented to person, place, and time.      ED Treatments / Results  Labs (all labs ordered are listed, but only abnormal results are displayed) Labs Reviewed  URINALYSIS, ROUTINE W REFLEX  MICROSCOPIC - Abnormal; Notable for the following components:      Result Value   APPearance HAZY (*)    Hgb urine dipstick SMALL (*)    Ketones, ur 20 (*)    Leukocytes,Ua MODERATE (*)    Bacteria, UA RARE (*)    All other components within normal limits  CBC WITH DIFFERENTIAL/PLATELET - Abnormal; Notable for the following components:   WBC 14.3 (*)    Neutro Abs 10.3 (*)    All other components within normal limits  URINE CULTURE  BASIC METABOLIC PANEL  I-STAT BETA HCG BLOOD, ED ([facility], [facility], [facility] ONLY)    EKG    Radiology No results found.  Procedures Procedures (including critical care time)  Medications Ordered in ED Medications  oxyCODONE-acetaminophen (PERCOCET/ROXICET) 5-325 MG per tablet 1 tablet (1 tablet Oral Refused 11/28/18 0146)     Initial Impression / Assessment and Plan / ED Course  I have reviewed the triage vital signs and the nursing notes.  Pertinent labs & imaging results that were available during my care of the patient were reviewed by me and considered in my medical decision making (see chart for details).  23 year old female here with right flank pain that began last evening.  She reports history of kidney stones and states this feels similar.  She reports urinary frequency but denies any dysuria or hematuria.  She is afebrile and nontoxic in appearance.  Labs as above, does have mild leukocytosis.  UA with rare bacteria but 21-50 WBCs and moderate leukocytes.  Culture is pending.  CT obtained without stone noted, however some dilation of right renal collecting system, possible recently passed stone versus infection.  Given her UA results, favor infection.  Will treat with course of antibiotics.  She will need to follow-up with her primary care doctor.  I have also given her information for local urology clinic if no improvement.  She can return here for any new or acute changes. Final Clinical Impressions(s) / ED Diagnoses   Final diagnoses:  Pyelonephritis     ED Discharge Orders         Ordered    cephALEXin (KEFLEX) 500 MG capsule  2 times daily     11/28/18 0308    HYDROcodone-acetaminophen (NORCO/VICODIN) 5-325 MG tablet  Every 4 hours PRN     11/28/18 0309           Larene Pickett, PA-C 11/28/18 2426    Ezequiel Essex, MD 11/28/18 [id number]

## 2018-11-28 NOTE — ED Notes (Signed)
Patient states that she is not experiencing any pain right now because she took ibuprofen before coming here. Denies any needs at this time. Will continue to monitor patient.

## 2018-11-28 NOTE — ED Notes (Signed)
Patient transported to CT 

## 2018-11-29 ENCOUNTER — Telehealth: Payer: Self-pay | Admitting: Primary Care

## 2018-11-29 DIAGNOSIS — B379 Candidiasis, unspecified: Secondary | ICD-10-CM

## 2018-11-29 DIAGNOSIS — N760 Acute vaginitis: Secondary | ICD-10-CM

## 2018-11-29 DIAGNOSIS — B9689 Other specified bacterial agents as the cause of diseases classified elsewhere: Secondary | ICD-10-CM

## 2018-11-29 LAB — URINE CULTURE: Culture: NO GROWTH

## 2018-11-29 MED ORDER — METRONIDAZOLE 500 MG PO TABS: 500.0000 mg | ORAL_TABLET | Freq: Two times a day (BID) | ORAL | 0 refills | Status: DC

## 2018-11-29 NOTE — Telephone Encounter (Signed)
Noted, Rx sent for tablets to pharmacy.

## 2018-11-29 NOTE — Telephone Encounter (Signed)
Patient stated that she was prescribed METROGEL instead of a pill. She stated she has not started using this yet and would like to see if she could get the pill prescribed instead.   ([id number]   She also stated that she is not needing the order for the ultrasound anymore since she ended up going to the hospital they did this when she was there    Patient's C/B # [phone]

## 2018-12-12 MED ORDER — FLUCONAZOLE 150 MG PO TABS: 150.0000 mg | ORAL_TABLET | Freq: Once | ORAL | 0 refills | Status: AC

## 2018-12-12 NOTE — Telephone Encounter (Signed)
Pt called because she thinks she has a yeast infection form the antibiotics that she just finished. She is having constant itching with mild burning- symptoms started Mon 7/6- same say she finished the antibiotic.  She is requesting something sent to [facility] at [address], [place] or advice on OTC remedy.

## 2018-12-12 NOTE — Addendum Note (Signed)
Addended by: Pleas Koch on: 12/12/2018 12:46 PM   Modules accepted: Orders

## 2018-12-12 NOTE — Telephone Encounter (Signed)
Please notify patient that I sent a prescription for Diflucan for potential yeast infection from antibiotic use.  Take once.

## 2018-12-12 NOTE — Telephone Encounter (Signed)
Tried to patient but could leave message since voicemail is full

## 2018-12-13 NOTE — Telephone Encounter (Signed)
Patient returned Chan's call.  I advised patient rx was sent to pharmacy.

## 2018-12-15 DIAGNOSIS — Z1159 Encounter for screening for other viral diseases: Secondary | ICD-10-CM | POA: Diagnosis not present

## 2019-03-17 ENCOUNTER — Other Ambulatory Visit: Payer: Self-pay

## 2019-03-17 ENCOUNTER — Encounter: Payer: Self-pay | Admitting: Primary Care

## 2019-03-17 ENCOUNTER — Ambulatory Visit (INDEPENDENT_AMBULATORY_CARE_PROVIDER_SITE_OTHER): Payer: BC Managed Care – PPO | Admitting: Primary Care

## 2019-03-17 VITALS — BP 116/70 | HR 74 | Temp 97.8°F | Ht 66.5 in | Wt 166.5 lb

## 2019-03-17 DIAGNOSIS — H00015 Hordeolum externum left lower eyelid: Secondary | ICD-10-CM | POA: Diagnosis not present

## 2019-03-17 MED ORDER — ERYTHROMYCIN 5 MG/GM OP OINT: 1.0000 "application " | TOPICAL_OINTMENT | Freq: Three times a day (TID) | OPHTHALMIC | 0 refills | Status: DC

## 2019-03-17 NOTE — Progress Notes (Signed)
Subjective:    Patient ID: Jennifer [name], female    DOB: April 23, 1996, 23 y.o.   MRN: [id number]  HPI  Jennifer [name] is a 23 year old female who presents today with a chief complaint of stye.  Her stye is located to the left lower inner eye lid that she first noticed four days ago. Since then the stye has grown in size with discomfort, redness and swelling. She's applied warm tea bags over the last two days, prior to then warm wash clothes. Yesterday she noticed the stye "coming to a head" but has not popped it. She has missed work over the last two days.   She denies visual changes, fevers, dizziness. She has noticed increased tenderness under left eye.  Review of Systems  Constitutional: Negative for fever.  Eyes: Positive for pain and redness. Negative for discharge and visual disturbance.       Stye       Past Medical History:  Diagnosis Date  . Irregular periods   . Kidney stone on right side 12/24/2017  . Strabismus      Social History   Socioeconomic History  . Marital status: Single    Spouse name: Not on file  . Number of children: Not on file  . Years of education: Not on file  . Highest education level: Not on file  Occupational History  . Not on file  Social Needs  . Financial resource strain: Not on file  . Food insecurity    Worry: Not on file    Inability: Not on file  . Transportation needs    Medical: Not on file    Non-medical: Not on file  Tobacco Use  . Smoking status: Former Smoker    Quit date: 03/20/2015    Years since quitting: 3.9  . Smokeless tobacco: Never Used  Substance and Sexual Activity  . Alcohol use: Yes    Alcohol/week: 0.0 standard drinks    Comment: occ  . Drug use: No  . Sexual activity: Yes  Lifestyle  . Physical activity    Days per week: Not on file    Minutes per session: Not on file  . Stress: Not on file  Relationships  . Social Herbalist on phone: Not on file    Gets together: Not on file    Attends religious service: Not on file    Active member of club or organization: Not on file    Attends meetings of clubs or organizations: Not on file    Relationship status: Not on file  . Intimate partner violence    Fear of current or ex partner: Not on file    Emotionally abused: Not on file    Physically abused: Not on file    Forced sexual activity: Not on file  Other Topics Concern  . Not on file  Social History Narrative   Attending [employer] in the fall.   Sexually active, uses condoms.   Lives with dad.   Mom is recovering alcoholic.   Has good support system- both grandmothers live near by.      Past Surgical History:  Procedure Laterality Date  . EXTRACORPOREAL SHOCK WAVE LITHOTRIPSY Right 01/03/2018   Procedure: EXTRACORPOREAL SHOCK WAVE LITHOTRIPSY (ESWL);  Surgeon: Abbie Sons, MD;  Location: [facility] ORS;  Service: Urology;  Laterality: Right;  . EYE SURGERY    . WISDOM TOOTH EXTRACTION      Family History  Problem Relation Age of  Onset  . Alcohol abuse Mother     Allergies  Allergen Reactions  . Sulfa Antibiotics Hives    Current Outpatient Medications on File Prior to Visit  Medication Sig Dispense Refill  . ibuprofen (ADVIL) 200 MG tablet Take 600 mg by mouth every 6 (six) hours as needed for moderate pain.     No current facility-administered medications on file prior to visit.     BP 116/70   Pulse 74   Temp 97.8 F (36.6 C) (Temporal)   Ht 5' 6.5" (1.689 m)   Wt 166 lb 8 oz (75.5 kg)   LMP 02/16/2019   SpO2 98%   BMI 26.47 kg/m    Objective:   Physical Exam  Constitutional: She appears well-nourished.  Eyes: Left eye exhibits hordeolum. Left eye exhibits no discharge and no exudate. Left conjunctiva is not injected. Left conjunctiva has no hemorrhage.    Moderate erythema with swelling to left lower eye lid. 0.25-0.5 cm rounded red hordeolum noted. No drainage. Tender.   Respiratory: Effort normal.           Assessment & Plan:

## 2019-03-17 NOTE — Assessment & Plan Note (Signed)
Obvious to left eye lid, appears either very irritated or showing signs of infection.   Rx for erythromycin ointment sent to pharmacy with instructions. Continue warm compresses. She will update if symptoms do not improve.

## 2019-03-17 NOTE — Patient Instructions (Signed)
Apply 1 cm ribbon to left eye three times daily for the next 3-5 days for infection.  Please update me if no improvement within 3-4 days.  Continue applying warm compresses as discussed.  It was a pleasure to see you today!

## 2019-03-19 ENCOUNTER — Telehealth: Payer: Self-pay | Admitting: Primary Care

## 2019-03-19 NOTE — Telephone Encounter (Signed)
Spoken and notified patient of Kate Clark's comments. Patient verbalized understanding.  

## 2019-03-19 NOTE — Telephone Encounter (Signed)
Please have patient call her eye doctor for evaluation. She shouldn't need a referral but have her notify is if Dr. Sherril Croon office requires this.

## 2019-03-19 NOTE — Telephone Encounter (Signed)
Best number [phone]  Pt called to say her eye isn't any better and wanted to know if it could be lanced.   Can kate do this or does she need a referral?  She has eye dr.  [facility]  Please advise

## 2019-03-20 DIAGNOSIS — H00015 Hordeolum externum left lower eyelid: Secondary | ICD-10-CM | POA: Diagnosis not present

## 2019-04-25 DIAGNOSIS — L813 Cafe au lait spots: Secondary | ICD-10-CM | POA: Diagnosis not present

## 2019-04-25 DIAGNOSIS — D225 Melanocytic nevi of trunk: Secondary | ICD-10-CM | POA: Diagnosis not present

## 2019-06-10 DIAGNOSIS — R6883 Chills (without fever): Secondary | ICD-10-CM | POA: Diagnosis not present

## 2019-06-10 DIAGNOSIS — R509 Fever, unspecified: Secondary | ICD-10-CM | POA: Diagnosis not present

## 2019-06-10 DIAGNOSIS — Z20828 Contact with and (suspected) exposure to other viral communicable diseases: Secondary | ICD-10-CM | POA: Diagnosis not present

## 2019-06-10 DIAGNOSIS — R05 Cough: Secondary | ICD-10-CM | POA: Diagnosis not present

## 2019-10-15 DIAGNOSIS — Z03818 Encounter for observation for suspected exposure to other biological agents ruled out: Secondary | ICD-10-CM | POA: Diagnosis not present

## 2019-10-15 DIAGNOSIS — Z20828 Contact with and (suspected) exposure to other viral communicable diseases: Secondary | ICD-10-CM | POA: Diagnosis not present

## 2019-11-22 IMAGING — US US RENAL
1 series · 14 of 25 positions shown · non-contrast
Comparison: None.

CLINICAL DATA: UTI, hematuria.  Pain rule out stone

EXAM:
RENAL / URINARY TRACT ULTRASOUND COMPLETE

[Series 1: us renal · 0.23mm/px · 14 of 37 slices shown]
[im 1/37]
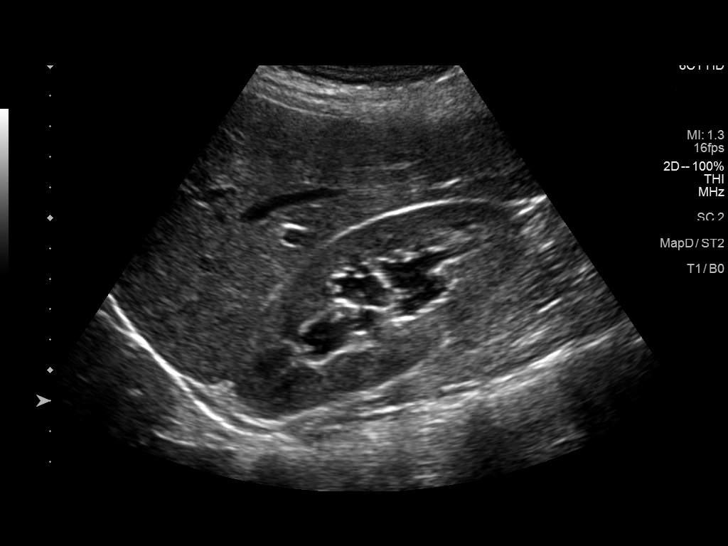
[im 4/37]
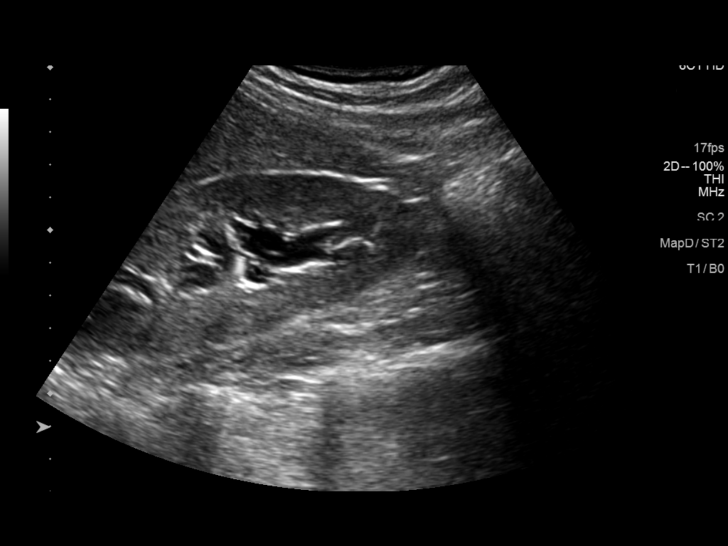
[im 7/37]
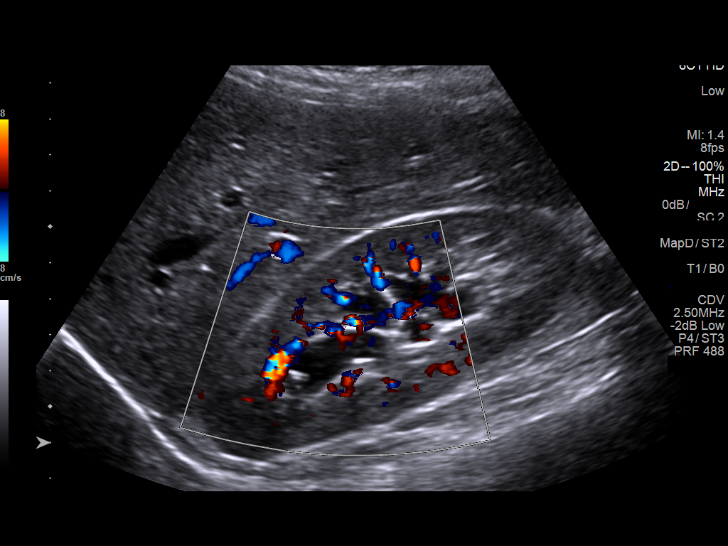
[im 10/37]
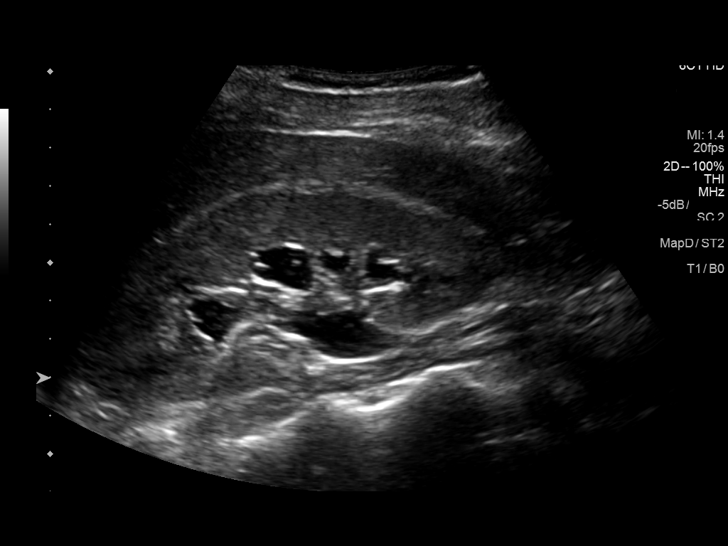
[im 13/37]
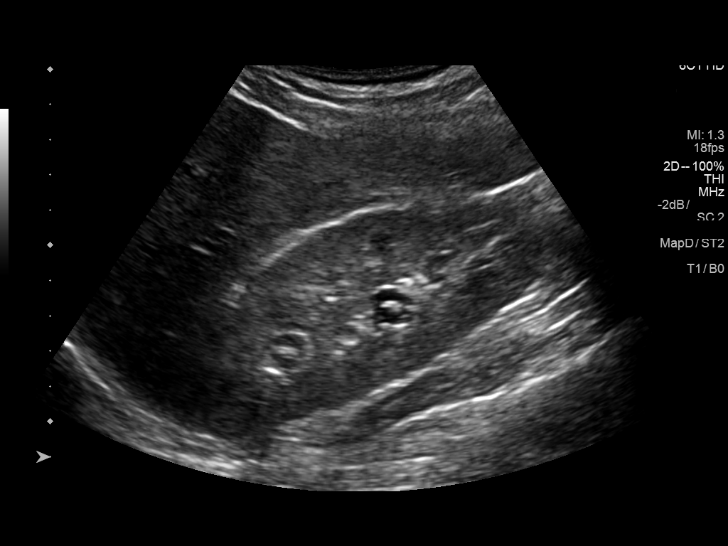
[im 14/37]
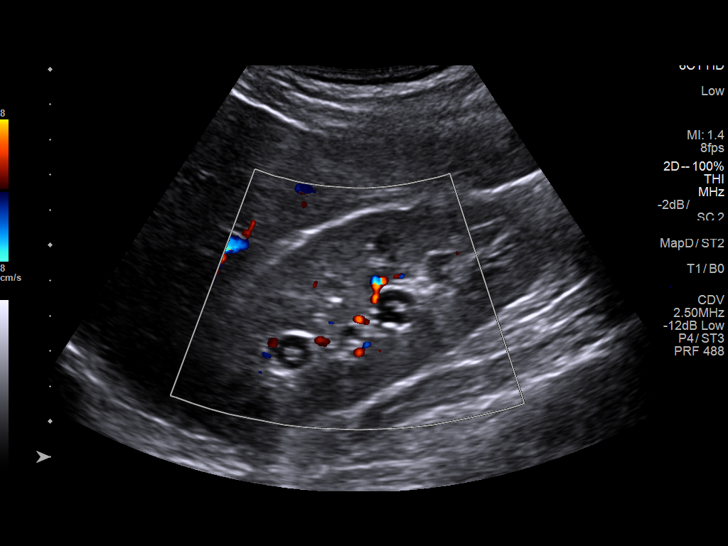
[im 17/37]
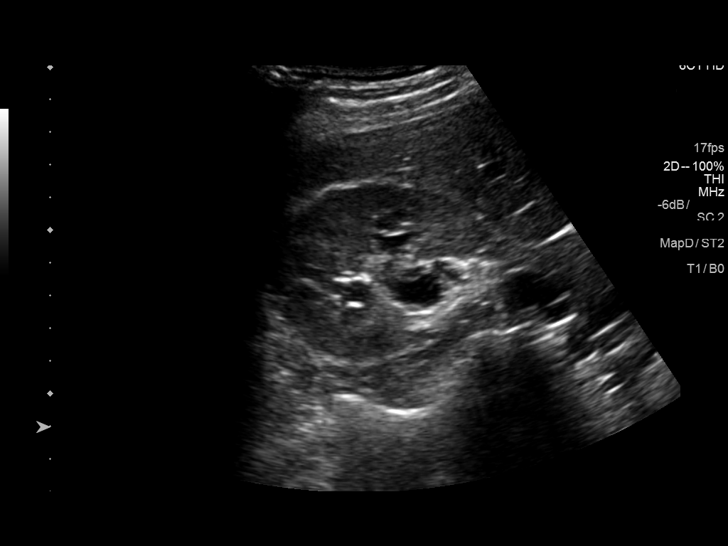
[im 20/37]
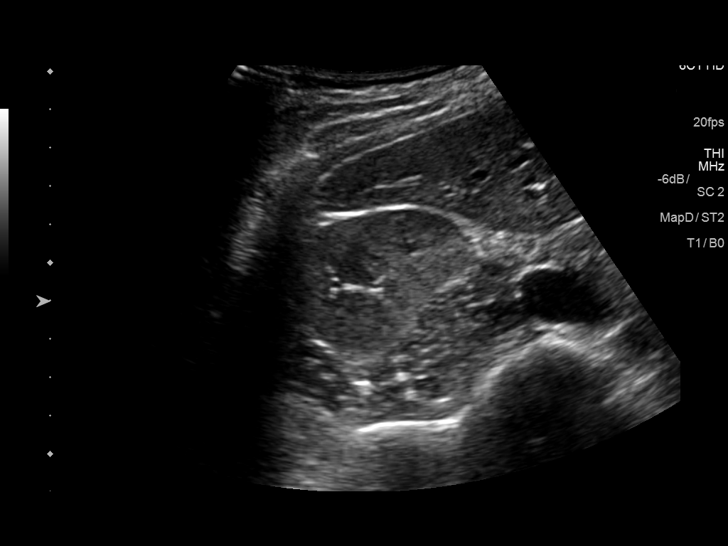
[im 23/37]
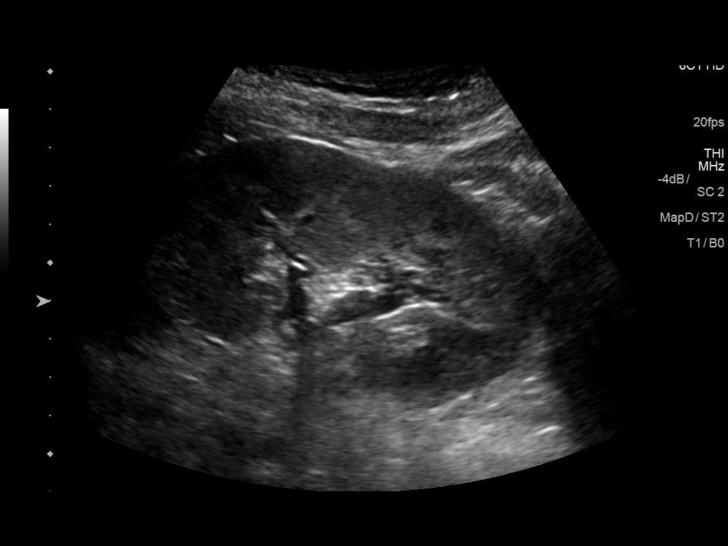
[im 25/37]
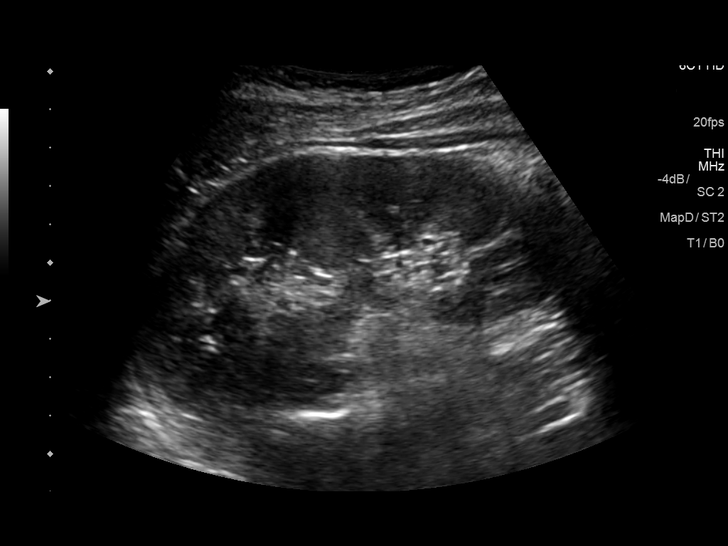
[im 28/37]
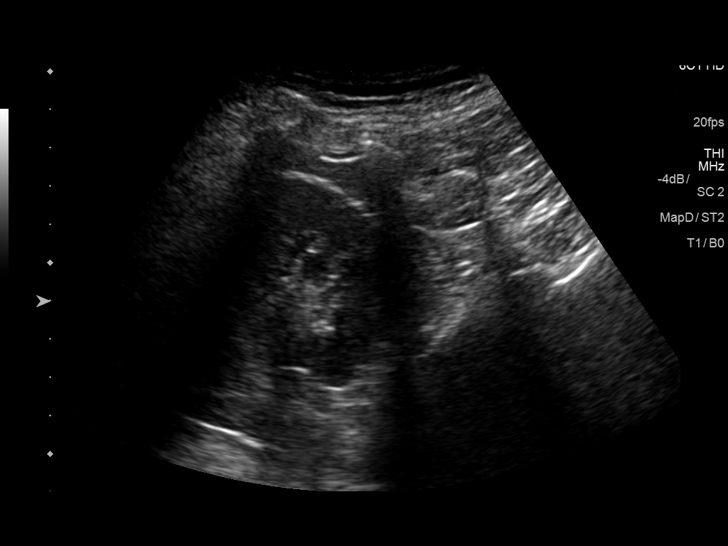
[im 31/37]
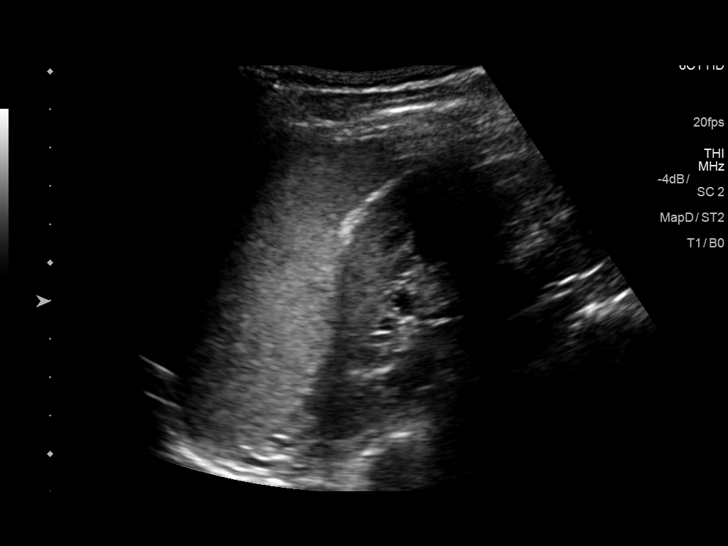
[im 34/37]
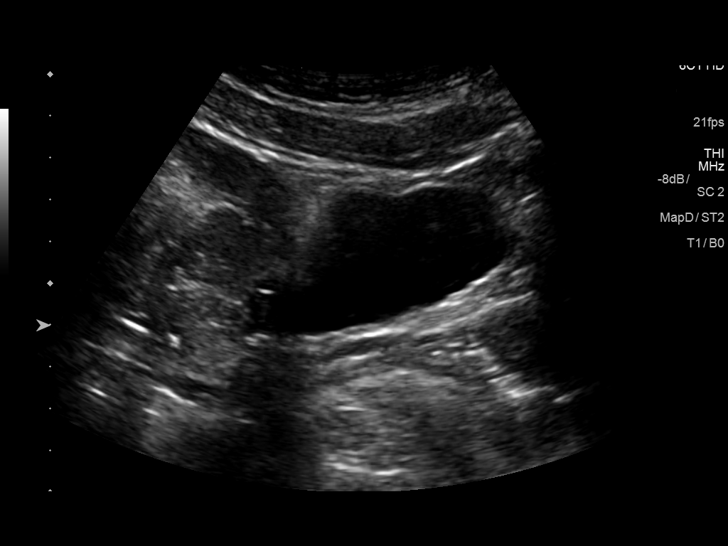
[im 37/37]
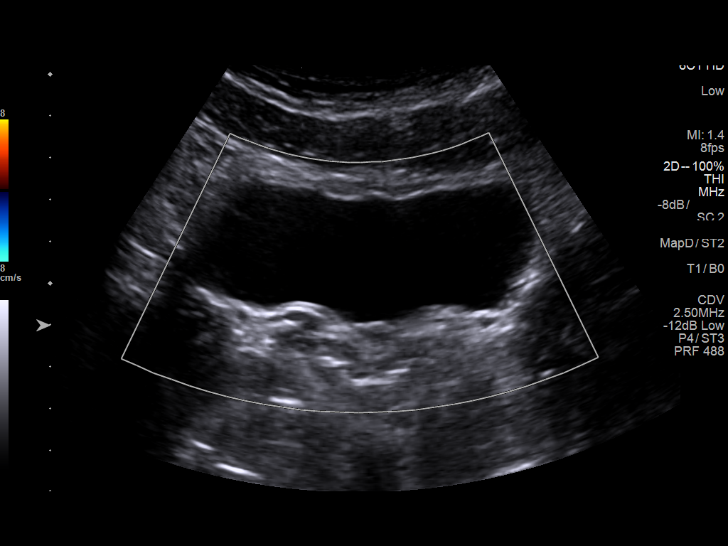

[14 of 25 positions shown; findings below may reference images not displayed]

FINDINGS: Right Kidney:

Length: 11.8 cm. Moderate hydronephrosis on the right. No mass
lesion. No renal calculi.

Left Kidney:

Length: 11.5 cm. Echogenicity within normal limits. No mass or
hydronephrosis visualized.

Bladder:

Ureteral jets not identified bilaterally
IMPRESSION: Moderate right hydronephrosis. This could be due to obstructing
stone. Correlate with symptoms. CT abdomen pelvis may be helpful to
evaluate for stone disease.

## 2019-12-22 ENCOUNTER — Telehealth (INDEPENDENT_AMBULATORY_CARE_PROVIDER_SITE_OTHER): Payer: BC Managed Care – PPO | Admitting: Family Medicine

## 2019-12-22 ENCOUNTER — Other Ambulatory Visit: Payer: Self-pay

## 2019-12-22 ENCOUNTER — Encounter: Payer: Self-pay | Admitting: Family Medicine

## 2019-12-22 ENCOUNTER — Telehealth: Payer: Self-pay | Admitting: Family Medicine

## 2019-12-22 VITALS — HR 72 | Temp 96.7°F | Ht 66.5 in | Wt 159.0 lb

## 2019-12-22 DIAGNOSIS — R300 Dysuria: Secondary | ICD-10-CM | POA: Insufficient documentation

## 2019-12-22 DIAGNOSIS — R3915 Urgency of urination: Secondary | ICD-10-CM | POA: Diagnosis not present

## 2019-12-22 LAB — POC URINALSYSI DIPSTICK (AUTOMATED)
Bilirubin, UA: NEGATIVE
Glucose, UA: NEGATIVE
Leukocytes, UA: NEGATIVE
Nitrite, UA: NEGATIVE
Protein, UA: NEGATIVE
Spec Grav, UA: 1.025 (ref 1.010–1.025)
Urobilinogen, UA: 0.2 E.U./dL
pH, UA: 6 (ref 5.0–8.0)

## 2019-12-22 NOTE — Assessment & Plan Note (Signed)
Reviewed prior urine culture which was negative last year. Discussed given normal UA UTI less likely. Offered UCx but she decided to watch and wait. Discussed avoiding bladder irritants. Initially noted to be sexually active but does not engage in penetrative intercourse so pregnancy less like - though advised pregnancy test if she misses her period. Warning precautions discussed and advised f/u if symptoms do not resolve with in-person exam.

## 2019-12-22 NOTE — Telephone Encounter (Signed)
I spoke to patient and she switched appointment to virtual.

## 2019-12-22 NOTE — Telephone Encounter (Signed)
Patient called.  Patient dropped off a urine sample earlier. She has an appointment with Dr.Cody today at 4:20.  Patient wants to know since she dropped off the urine sample does she have to come in for the office visit?

## 2019-12-22 NOTE — Patient Instructions (Signed)
Avoid Bladder irritants for the next week or until symptoms improve - Alcohol - Caffeine - Citrus - Carbonated beverages  Drink lots of water Consider Cranberry tablets or cranberry juice to help with pain  Return to clinic if symptoms do not improve or if you develop new symptoms - fever/chills, flank pain, worsening abdominal pain.   Consider pregnancy test if you miss your period

## 2019-12-22 NOTE — Progress Notes (Signed)
I connected with Jennifer [name] on 12/22/19 at  4:20 PM EDT by video and verified that I am speaking with the correct person using two identifiers.   I discussed the limitations, risks, security and privacy concerns of performing an evaluation and management service by video and the availability of in person appointments. I also discussed with the patient that there may be a patient responsible charge related to this service. The patient expressed understanding and agreed to proceed.  Patient location: Home Provider Location: [place] Participants: Lesleigh Noe and [name]   Subjective:     Jennifer [name] is a 24 y.o. female presenting for Urinary Urgency (x 6 days ), Urinary Frequency (x 6 days ), Abdominal Pain, and Dysuria (x 6 days )     Urinary Frequency  This is a new problem. The current episode started in the past 7 days. Associated symptoms include a discharge, frequency, hesitancy and urgency. Pertinent negatives include no flank pain, hematuria, nausea, possible pregnancy or vomiting. Associated symptoms comments: Some discomfort w/ urination, and central lower abdominal pain. Her past medical history is significant for kidney stones.  Abdominal Pain Associated symptoms include dysuria and frequency. Pertinent negatives include no hematuria, nausea or vomiting.  Dysuria  Associated symptoms include a discharge, frequency, hesitancy and urgency. Pertinent negatives include no flank pain, hematuria, nausea, possible pregnancy or vomiting. Her past medical history is significant for kidney stones.   Denies pubic pain For 3 days has been taking Azo to help with the pain  Is sexually active - birth control - does not have penetrative intercourse  Feels like period cramps About 1 month ago was screened for STI and it was negative - one partner  No prior pregnancy  Review of Systems  Gastrointestinal: Positive for abdominal pain.  Negative for nausea and vomiting.  Genitourinary: Positive for dysuria, frequency, hesitancy and urgency. Negative for flank pain and hematuria.     Social History   Tobacco Use  Smoking Status Former Smoker  . Quit date: 03/20/2015  . Years since quitting: 4.7  Smokeless Tobacco Never Used        Objective:   BP Readings from Last 3 Encounters:  03/17/19 116/70  11/28/18 124/71  11/27/18 112/72   Wt Readings from Last 3 Encounters:  12/22/19 159 lb (72.1 kg)  03/17/19 166 lb 8 oz (75.5 kg)  11/28/18 166 lb 8 oz (75.5 kg)   Pulse 72   Temp (!) 96.7 F (35.9 C) (Oral)   Ht 5' 6.5" (1.689 m)   Wt 159 lb (72.1 kg)   BMI 25.28 kg/m    Physical Exam Constitutional:      Appearance: Normal appearance. She is not ill-appearing.  HENT:     Head: Normocephalic and atraumatic.     Right Ear: External ear normal.     Left Ear: External ear normal.  Eyes:     Conjunctiva/sclera: Conjunctivae normal.  Pulmonary:     Effort: Pulmonary effort is normal. No respiratory distress.  Neurological:     Mental Status: She is alert. Mental status is at baseline.  Psychiatric:        Mood and Affect: Mood normal.        Behavior: Behavior normal.        Thought Content: Thought content normal.        Judgment: Judgment normal.             Assessment & Plan:  Problem List Items Addressed This Visit      Other   Urinary urgency    Reviewed prior urine culture which was negative last year. Discussed given normal UA UTI less likely. Offered UCx but she decided to watch and wait. Discussed avoiding bladder irritants. Initially noted to be sexually active but does not engage in penetrative intercourse so pregnancy less like - though advised pregnancy test if she misses her period. Warning precautions discussed and advised f/u if symptoms do not resolve with in-person exam.        Other Visit Diagnoses    Dysuria    -  Primary   Relevant Orders   POCT Urinalysis Dipstick  (Automated) (Completed)       Return if symptoms worsen or fail to improve.  Lesleigh Noe, MD

## 2019-12-22 NOTE — Telephone Encounter (Signed)
Dr. Einar Pheasant says it's fine if we switch her to a virtual visit instead of in person.

## 2019-12-23 ENCOUNTER — Other Ambulatory Visit: Payer: Self-pay

## 2019-12-23 ENCOUNTER — Encounter: Payer: Self-pay | Admitting: Family Medicine

## 2019-12-23 ENCOUNTER — Ambulatory Visit: Payer: BC Managed Care – PPO | Admitting: Family Medicine

## 2019-12-23 VITALS — BP 110/70 | HR 63 | Temp 98.2°F | Wt 160.2 lb

## 2019-12-23 DIAGNOSIS — R300 Dysuria: Secondary | ICD-10-CM | POA: Diagnosis not present

## 2019-12-23 DIAGNOSIS — R102 Pelvic and perineal pain: Secondary | ICD-10-CM

## 2019-12-23 LAB — POC URINALSYSI DIPSTICK (AUTOMATED)
Bilirubin, UA: NEGATIVE
Glucose, UA: NEGATIVE
Leukocytes, UA: NEGATIVE
Nitrite, UA: NEGATIVE
Protein, UA: NEGATIVE
Spec Grav, UA: >=1.03 — AB (ref 1.010–1.025)
Urobilinogen, UA: 0.2 E.U./dL
pH, UA: 6 (ref 5.0–8.0)

## 2019-12-23 NOTE — Assessment & Plan Note (Signed)
UA again w/ trace blood and w/o signs of infection. Given persistence of symptoms will send for culture to rule this out. Treatment - pain relievers and can try cranberry juice. Long discussion regarding symptoms and possible etiology. No signs of CMT so PID unlikely. Consider kidney stone given hx and trace blood but symptoms not consistent at this time. Discussed concern for possible interstitial cystis and recommended urology referral if UCx negative and symptoms do not improve. She is hesitant due to cost and recommended f/u with PCP for repeat urine if symptoms do resolve to make sure hematuria has resolved.

## 2019-12-23 NOTE — Progress Notes (Signed)
Subjective:     Jennifer [name] is a 24 y.o. female presenting for Urinary Urgency, Urinary Frequency, and Dysuria     HPI  #suprapubic pain - difficulty emptying the bladder - hx of kidney infection and worried about this - no back pain currently - no fever/chills, n/v - continues to have burning urination - continue to have frequency, urgency, difficulty emptying - pain - not like kidney stone in the past - had similar symptoms a few months ago which resolved with azo    Review of Systems   Social History   Tobacco Use  Smoking Status Former Smoker  . Quit date: 03/20/2015  . Years since quitting: 4.7  Smokeless Tobacco Never Used        Objective:    BP Readings from Last 3 Encounters:  12/23/19 110/70  03/17/19 116/70  11/28/18 124/71   Wt Readings from Last 3 Encounters:  12/23/19 160 lb 4 oz (72.7 kg)  12/22/19 159 lb (72.1 kg)  03/17/19 166 lb 8 oz (75.5 kg)    BP 110/70   Pulse 63   Temp 98.2 F (36.8 C) (Temporal)   Wt 160 lb 4 oz (72.7 kg)   SpO2 96%   BMI 25.48 kg/m    Physical Exam Exam conducted with a chaperone present.  Constitutional:      General: She is not in acute distress.    Appearance: She is well-developed. She is not diaphoretic.  HENT:     Head: Normocephalic and atraumatic.     Right Ear: External ear normal.     Left Ear: External ear normal.     Nose: Nose normal.  Eyes:     Conjunctiva/sclera: Conjunctivae normal.  Cardiovascular:     Rate and Rhythm: Normal rate and regular rhythm.     Heart sounds: Normal heart sounds.  Pulmonary:     Effort: Pulmonary effort is normal.  Abdominal:     General: Bowel sounds are normal. There is no distension.     Palpations: Abdomen is soft.     Tenderness: There is abdominal tenderness in the suprapubic area. There is no guarding.  Genitourinary:    Cervix: No cervical motion tenderness.     Uterus: Normal.      Adnexa: Right adnexa normal and left adnexa  normal.  Musculoskeletal:     Cervical back: Neck supple.  Skin:    General: Skin is warm and dry.     Capillary Refill: Capillary refill takes less than 2 seconds.  Neurological:     Mental Status: She is alert. Mental status is at baseline.  Psychiatric:        Mood and Affect: Mood normal.        Behavior: Behavior normal.           Assessment & Plan:   Problem List Items Addressed This Visit      Other   Suprapubic pain    UA again w/ trace blood and w/o signs of infection. Given persistence of symptoms will send for culture to rule this out. Treatment - pain relievers and can try cranberry juice. Long discussion regarding symptoms and possible etiology. No signs of CMT so PID unlikely. Consider kidney stone given hx and trace blood but symptoms not consistent at this time. Discussed concern for possible interstitial cystis and recommended urology referral if UCx negative and symptoms do not improve. She is hesitant due to cost and recommended f/u with PCP for repeat urine if  symptoms do resolve to make sure hematuria has resolved.         Other Visit Diagnoses    Dysuria    -  Primary   Relevant Orders   Urine Culture   POCT Urinalysis Dipstick (Automated) (Completed)       Return if symptoms worsen or fail to improve.  Lesleigh Noe, MD  This visit occurred during the SARS-CoV-2 public health emergency.  Safety protocols were in place, including screening questions prior to the visit, additional usage of staff PPE, and extensive cleaning of exam room while observing appropriate contact time as indicated for disinfecting solutions.

## 2019-12-23 NOTE — Patient Instructions (Signed)
Your urine again did not show infection  There is a little blood present.   If the culture is negative, I would recommend urology consult as the next step.   Could try cranberry pills Hydration Avoid triggers  Concerning for possible Interstitial Cystis though this would likely require urology to help diagnosis

## 2019-12-24 ENCOUNTER — Ambulatory Visit: Payer: BC Managed Care – PPO | Admitting: Primary Care

## 2019-12-25 ENCOUNTER — Other Ambulatory Visit: Payer: Self-pay | Admitting: Family Medicine

## 2019-12-25 DIAGNOSIS — N3001 Acute cystitis with hematuria: Secondary | ICD-10-CM

## 2019-12-25 LAB — URINE CULTURE
MICRO NUMBER:: 10728403
SPECIMEN QUALITY:: ADEQUATE

## 2019-12-25 MED ORDER — NITROFURANTOIN MONOHYD MACRO 100 MG PO CAPS: 100.0000 mg | ORAL_CAPSULE | Freq: Two times a day (BID) | ORAL | 0 refills | Status: AC

## 2019-12-25 NOTE — Progress Notes (Signed)
Urine culture with infection. Sending in Abx

## 2019-12-29 ENCOUNTER — Telehealth: Payer: Self-pay | Admitting: Primary Care

## 2019-12-29 NOTE — Telephone Encounter (Signed)
Please call patient back and explain results.

## 2019-12-29 NOTE — Telephone Encounter (Signed)
Patient called in stating she is still waiting to hear from [place] in regards to message. Spoke with CMA's and did advise patient of note. Stated patient should be on correct medication. Advised patient of this. Patient stated she feels like that is not correct because medication was prescribed before culture came back. Patient stated she would like to speak with an RN or MD. Please advise.

## 2019-12-29 NOTE — Telephone Encounter (Signed)
Patient called. Patient had appointments with Dr.Cody on 12/22/19 and 12/23/19.  Patient said she was confused about why she was told she didn't have a uti and an antibiotic was sent to her pharmacy. I let her know Dr.Cody commented about the antibiotics in her urine culture results.  Patient said she usually gets a call with results.

## 2019-12-29 NOTE — Telephone Encounter (Signed)
Spoke with patient. Results sent via MyChart last week, but she did not check this. She has not started abx but still has symptoms. She will pick up the abx and start.   Discussed that in the future I will plan for someone to call her.

## 2020-01-16 ENCOUNTER — Telehealth: Payer: Self-pay | Admitting: Primary Care

## 2020-01-16 ENCOUNTER — Ambulatory Visit: Payer: BC Managed Care – PPO | Admitting: Family Medicine

## 2020-01-16 NOTE — Telephone Encounter (Signed)
Pt said she called earlier and let someone know that should would not be able to make her appointment today. I just spoke with patient at 11:25am and she told me her flight is delayed and she can't make her appointment. I let her know that she might be charged $50 no show fee for cancelling 24 hours before appointment. She said she called this morning and let us know she might not be able to make appointment and concerned about being charged. Pt would like a call back.

## 2020-01-20 ENCOUNTER — Encounter: Payer: Self-pay | Admitting: Family Medicine

## 2020-01-20 ENCOUNTER — Other Ambulatory Visit: Payer: Self-pay

## 2020-01-20 ENCOUNTER — Ambulatory Visit: Payer: BC Managed Care – PPO | Admitting: Family Medicine

## 2020-01-20 VITALS — BP 112/68 | HR 78 | Temp 96.9°F | Ht 66.5 in | Wt 157.6 lb

## 2020-01-20 DIAGNOSIS — Z7289 Other problems related to lifestyle: Secondary | ICD-10-CM | POA: Insufficient documentation

## 2020-01-20 DIAGNOSIS — M546 Pain in thoracic spine: Secondary | ICD-10-CM | POA: Insufficient documentation

## 2020-01-20 DIAGNOSIS — M5382 Other specified dorsopathies, cervical region: Secondary | ICD-10-CM | POA: Diagnosis not present

## 2020-01-20 DIAGNOSIS — M9901 Segmental and somatic dysfunction of cervical region: Secondary | ICD-10-CM | POA: Diagnosis not present

## 2020-01-20 DIAGNOSIS — M5384 Other specified dorsopathies, thoracic region: Secondary | ICD-10-CM | POA: Diagnosis not present

## 2020-01-20 DIAGNOSIS — M9902 Segmental and somatic dysfunction of thoracic region: Secondary | ICD-10-CM | POA: Diagnosis not present

## 2020-01-20 MED ORDER — CYCLOBENZAPRINE HCL 10 MG PO TABS: 5.0000 mg | ORAL_TABLET | Freq: Every evening | ORAL | 0 refills | Status: DC | PRN

## 2020-01-20 NOTE — Patient Instructions (Addendum)
I think you have a rhomboid strain/muscle spasm  Massage Deep heat Ice  Ibuprofen as needed  Stretching -try the stretch we did in the office  Do the things the chiropractor recommended and continue those visits    Make sure your pillow is comfortable and right for you  Change ergonomics at work if you can   Try the generic flexeril at night   Keep [place] posted

## 2020-01-20 NOTE — Progress Notes (Signed)
Subjective:    Patient ID: Jennifer [name], female    DOB: 11/10/1995, 24 y.o.   MRN: [id number]  This visit occurred during the SARS-CoV-2 public health emergency.  Safety protocols were in place, including screening questions prior to the visit, additional usage of staff PPE, and extensive cleaning of exam room while observing appropriate contact time as indicated for disinfecting solutions.    HPI 24 yo pt of NP [facility] presents with R shoulder pain   Started to notice it 3 mo ago -had a sharp pain occ in shoulder - with deep breath  Then more consistent pain this month  Sharp/dull/achey and occ tingly   Shoulder has good mobility  Hard to get comfortable at night  Lying on back or L side is better   Today -neck is sore (had an adjustment)  Popped in her upper back and neck   Sometimes vaping- not consistent  No cigarettes in years  She is worried about lung problems  No sob or wheeze or cough   No other symptoms   Saw chiropractor today Took xrays  Posture is bad  Neck is "too forward"  Issues with R neck   She has taken 400 mg of ibuprofen prn   Works looking down at a screen Lot of stress    Patient Active Problem List   Diagnosis Date Noted  . Engages in [facility] 01/20/2020  . Thoracic back pain 01/20/2020  . Suprapubic pain 12/23/2019  . Urinary urgency 12/22/2019  . Hordeolum externum of left lower eyelid 03/17/2019  . Skin irritation 06/20/2018  . Fatigue 03/25/2018  . Irregular periods 10/11/2010   Past Medical History:  Diagnosis Date  . Irregular periods   . Kidney stone on right side 12/24/2017  . Strabismus    Past Surgical History:  Procedure Laterality Date  . EXTRACORPOREAL SHOCK WAVE LITHOTRIPSY Right 01/03/2018   Procedure: EXTRACORPOREAL SHOCK WAVE LITHOTRIPSY (ESWL);  Surgeon: Abbie Sons, MD;  Location: [facility] ORS;  Service: Urology;  Laterality: Right;  . EYE SURGERY    . WISDOM TOOTH EXTRACTION     Social History   Tobacco  Use  . Smoking status: Former Smoker    Quit date: 03/20/2015    Years since quitting: 4.8  . Smokeless tobacco: Never Used  Vaping Use  . Vaping Use: Never used  Substance Use Topics  . Alcohol use: Yes    Alcohol/week: 0.0 standard drinks    Comment: occ  . Drug use: No   Family History  Problem Relation Age of Onset  . Alcohol abuse Mother    Allergies  Allergen Reactions  . Sulfa Antibiotics Hives   Current Outpatient Medications on File Prior to Visit  Medication Sig Dispense Refill  . ibuprofen (ADVIL) 200 MG tablet Take 600 mg by mouth every 6 (six) hours as needed for moderate pain.      No current facility-administered medications on file prior to visit.      Review of Systems  Constitutional: Negative for activity change, appetite change, fatigue, fever and unexpected weight change.  HENT: Negative for congestion, ear pain, rhinorrhea, sinus pressure and sore throat.   Eyes: Negative for pain, redness and visual disturbance.  Respiratory: Negative for cough, shortness of breath and wheezing.   Cardiovascular: Negative for chest pain and palpitations.  Gastrointestinal: Negative for abdominal pain, blood in stool, constipation and diarrhea.  Endocrine: Negative for polydipsia and polyuria.  Genitourinary: Negative for dysuria, frequency and urgency.  Musculoskeletal: Negative  for arthralgias, back pain and myalgias.  Skin: Negative for pallor and rash.  Allergic/Immunologic: Negative for environmental allergies.  Neurological: Negative for dizziness, syncope and headaches.  Hematological: Negative for adenopathy. Does not bruise/bleed easily.  Psychiatric/Behavioral: Negative for decreased concentration and dysphoric mood. The patient is not nervous/anxious.        Worried about finances and paying for medical care       Objective:   Physical Exam Constitutional:      General: She is not in acute distress.    Appearance: She is well-developed.  HENT:      Head: Normocephalic and atraumatic.  Eyes:     General: No scleral icterus.    Conjunctiva/sclera: Conjunctivae normal.     Pupils: Pupils are equal, round, and reactive to light.  Cardiovascular:     Rate and Rhythm: Normal rate and regular rhythm.  Pulmonary:     Effort: Pulmonary effort is normal.     Breath sounds: Normal breath sounds. No wheezing or rales.  Abdominal:     General: Bowel sounds are normal. There is no distension.     Palpations: Abdomen is soft.     Tenderness: There is no abdominal tenderness.  Musculoskeletal:        General: Tenderness present.     Cervical back: Normal range of motion and neck supple.     Thoracic back: Spasms present. No swelling or deformity.     Lumbar back: No edema, spasms, tenderness or bony tenderness. Normal range of motion.     Comments: Tender in musculature medial to R scapula  No bony tenderness Nl rom of shoulders  No rib tenderness  Demonstrated rhomboid stretch -which did target the area  No skin changes   Lymphadenopathy:     Cervical: No cervical adenopathy.  Skin:    General: Skin is warm and dry.     Coloration: Skin is not pale.     Findings: No erythema or rash.  Neurological:     Mental Status: She is alert.     Cranial Nerves: No cranial nerve deficit.     Sensory: No sensory deficit.     Motor: No atrophy or abnormal muscle tone.     Coordination: Coordination normal.     Deep Tendon Reflexes: Reflexes are normal and symmetric. Reflexes normal.     Comments: Negative SLR  Psychiatric:        Mood and Affect: Mood normal.           Assessment & Plan:   Problem List Items Addressed This Visit      Other   Engages in vaping    Pt has some worries about lung health but no sob or cough  Enc to quit  Offered cxr-decided to hold off for now       Thoracic back pain - Primary    Suspect rhomboid strain/spasm  She will continue chiropractor care Try to find a better pillow  Heat/ice and  massage Change work ergonomics if able  Ibuprofen prn  Flexeril px for pm to try if needed  PT may be helpful if no imp      Relevant Medications   cyclobenzaprine (FLEXERIL) 10 MG tablet

## 2020-01-20 NOTE — Assessment & Plan Note (Signed)
Suspect rhomboid strain/spasm  She will continue chiropractor care Try to find a better pillow  Heat/ice and massage Change work ergonomics if able  Ibuprofen prn  Flexeril px for pm to try if needed  PT may be helpful if no imp

## 2020-01-20 NOTE — Assessment & Plan Note (Signed)
Pt has some worries about lung health but no sob or cough  Enc to quit  Offered cxr-decided to hold off for now

## 2020-01-22 DIAGNOSIS — M5382 Other specified dorsopathies, cervical region: Secondary | ICD-10-CM | POA: Diagnosis not present

## 2020-01-22 DIAGNOSIS — M9902 Segmental and somatic dysfunction of thoracic region: Secondary | ICD-10-CM | POA: Diagnosis not present

## 2020-01-22 DIAGNOSIS — M9901 Segmental and somatic dysfunction of cervical region: Secondary | ICD-10-CM | POA: Diagnosis not present

## 2020-01-22 DIAGNOSIS — M5384 Other specified dorsopathies, thoracic region: Secondary | ICD-10-CM | POA: Diagnosis not present

## 2020-01-23 NOTE — Telephone Encounter (Addendum)
Will forward this to Raquel Sarna, [employer] to follow up with provider and patient regarding waiving the no show fee.

## 2020-01-26 DIAGNOSIS — M47812 Spondylosis without myelopathy or radiculopathy, cervical region: Secondary | ICD-10-CM | POA: Diagnosis not present

## 2020-01-26 DIAGNOSIS — M5384 Other specified dorsopathies, thoracic region: Secondary | ICD-10-CM | POA: Diagnosis not present

## 2020-01-26 DIAGNOSIS — M9901 Segmental and somatic dysfunction of cervical region: Secondary | ICD-10-CM | POA: Diagnosis not present

## 2020-01-26 DIAGNOSIS — M5382 Other specified dorsopathies, cervical region: Secondary | ICD-10-CM | POA: Diagnosis not present

## 2020-01-26 DIAGNOSIS — M9902 Segmental and somatic dysfunction of thoracic region: Secondary | ICD-10-CM | POA: Diagnosis not present

## 2020-01-28 DIAGNOSIS — M5382 Other specified dorsopathies, cervical region: Secondary | ICD-10-CM | POA: Diagnosis not present

## 2020-01-28 DIAGNOSIS — M9901 Segmental and somatic dysfunction of cervical region: Secondary | ICD-10-CM | POA: Diagnosis not present

## 2020-01-28 DIAGNOSIS — M5384 Other specified dorsopathies, thoracic region: Secondary | ICD-10-CM | POA: Diagnosis not present

## 2020-01-28 DIAGNOSIS — M9902 Segmental and somatic dysfunction of thoracic region: Secondary | ICD-10-CM | POA: Diagnosis not present

## 2020-01-28 NOTE — Telephone Encounter (Signed)
I called and talked to patient. I explained the policy and that considering the circumstances, we would waive it this one time since her flight delay was an unexpected situation. She verbalized understanding and said thank you.

## 2020-02-04 DIAGNOSIS — M5382 Other specified dorsopathies, cervical region: Secondary | ICD-10-CM | POA: Diagnosis not present

## 2020-02-04 DIAGNOSIS — M9902 Segmental and somatic dysfunction of thoracic region: Secondary | ICD-10-CM | POA: Diagnosis not present

## 2020-02-04 DIAGNOSIS — M5384 Other specified dorsopathies, thoracic region: Secondary | ICD-10-CM | POA: Diagnosis not present

## 2020-02-04 DIAGNOSIS — M9901 Segmental and somatic dysfunction of cervical region: Secondary | ICD-10-CM | POA: Diagnosis not present

## 2020-02-05 ENCOUNTER — Ambulatory Visit: Payer: BC Managed Care – PPO | Admitting: Primary Care

## 2020-02-05 ENCOUNTER — Encounter: Payer: Self-pay | Admitting: Primary Care

## 2020-02-05 ENCOUNTER — Other Ambulatory Visit: Payer: Self-pay

## 2020-02-05 DIAGNOSIS — M546 Pain in thoracic spine: Secondary | ICD-10-CM | POA: Diagnosis not present

## 2020-02-05 NOTE — Patient Instructions (Signed)
Continue to work on stretching your neck. Work on posture.  Start Ibuprofen 2-3 tablets every 8 hours. Take 1/2 tablet of the cyclobenzaprine as needed for spasms/stiffness.  Please update me if no improvement or if symptoms increase.  It was a pleasure to see you today!

## 2020-02-05 NOTE — Progress Notes (Signed)
Subjective:    Patient ID: Jennifer [name], female    DOB: [birth year], 24 y.o.   MRN: [id number]  HPI  This visit occurred during the SARS-CoV-2 public health emergency.  Safety protocols were in place, including screening questions prior to the visit, additional usage of staff PPE, and extensive cleaning of exam room while observing appropriate contact time as indicated for disinfecting solutions.   Jennifer [name] is a 24 year old female with a history of irregular menstrual cycles, urinary urgency, suprapubic pain, thoracic back pain who presents today with a chief complaint of back and neck pain.  Her pain began about 6 weeks ago that is located to the right upper thoracic back with radiation up to the right posterior neck. She has noticed some intermittent tingling to the right upper extremity, pain was worse at that time, but tingling has abated. She does notice the right back pain on occasion when taking a deep breath which is a cause for concern.   She saw Jennifer [name] last week, was told that she had a rhomboid strain/spasm and to continue with chiropractic care. She was prescribed cyclobenzaprine 10 mg.   She's been seeing a chiropractor for the last three weeks, completed an xray of the cervical and thoracic spine. She was told that she has an "abnormal curvature" to the neck and "arthritis".   She took two doses of cyclobenzaprine didn't notice much improvement. She has noticed some improvement with infrequent use of Ibuprofen.   She denies injury/trauma, heavy lifting. She does work at [employer] and constantly looks downward at [employer], phones, computers all day. She is trying to work on correcting her posture.   Review of Systems  Respiratory: Negative for cough.   Musculoskeletal: Positive for arthralgias, back pain and neck pain.  Skin: Negative for color change.  Neurological: Negative for weakness and numbness.       Past Medical History:  Diagnosis Date  . Irregular  periods   . Kidney stone on right side 12/24/2017  . Strabismus      Social History   Socioeconomic History  . Marital status: Single    Spouse name: Not on file  . Number of children: Not on file  . Years of education: Not on file  . Highest education level: Not on file  Occupational History  . Not on file  Tobacco Use  . Smoking status: Former Smoker    Quit date: 03/20/2015    Years since quitting: 4.8  . Smokeless tobacco: Never Used  Vaping Use  . Vaping Use: Never used  Substance and Sexual Activity  . Alcohol use: Yes    Alcohol/week: 0.0 standard drinks    Comment: occ  . Drug use: No  . Sexual activity: Yes  Other Topics Concern  . Not on file  Social History Narrative   Attending [employer] in the fall.   Sexually active, uses condoms.   Lives with dad.   Mom is recovering alcoholic.   Has good support system- both grandmothers live near by.     Social Determinants of Health   Financial Resource Strain:   . Difficulty of Paying Living Expenses: Not on file  Food Insecurity:   . Worried About Charity fundraiser in the Last Year: Not on file  . Ran Out of Food in the Last Year: Not on file  Transportation Needs:   . Lack of Transportation (Medical): Not on file  . Lack of Transportation (Non-Medical): Not on file  Physical Activity:   . Days of Exercise per Week: Not on file  . Minutes of Exercise per Session: Not on file  Stress:   . Feeling of Stress : Not on file  Social Connections:   . Frequency of Communication with Friends and Family: Not on file  . Frequency of Social Gatherings with Friends and Family: Not on file  . Attends Religious Services: Not on file  . Active Member of Clubs or Organizations: Not on file  . Attends Archivist Meetings: Not on file  . Marital Status: Not on file  Intimate Partner Violence:   . Fear of Current or Ex-Partner: Not on file  . Emotionally Abused: Not on file  . Physically Abused: Not on file  .  Sexually Abused: Not on file    Past Surgical History:  Procedure Laterality Date  . EXTRACORPOREAL SHOCK WAVE LITHOTRIPSY Right 01/03/2018   Procedure: EXTRACORPOREAL SHOCK WAVE LITHOTRIPSY (ESWL);  Surgeon: Abbie Sons, MD;  Location: [facility] ORS;  Service: Urology;  Laterality: Right;  . EYE SURGERY    . WISDOM TOOTH EXTRACTION      Family History  Problem Relation Age of Onset  . Alcohol abuse Mother     Allergies  Allergen Reactions  . Sulfa Antibiotics Hives    Current Outpatient Medications on File Prior to Visit  Medication Sig Dispense Refill  . cyclobenzaprine (FLEXERIL) 10 MG tablet Take 0.5-1 tablets (5-10 mg total) by mouth at bedtime as needed for muscle spasms. 10 tablet 0  . ibuprofen (ADVIL) 200 MG tablet Take 600 mg by mouth every 6 (six) hours as needed for moderate pain.      No current facility-administered medications on file prior to visit.    BP 120/80   Pulse 80   Ht 5' 6.5" (1.689 m)   Wt 158 lb (71.7 kg)   LMP 01/04/2020   SpO2 96%   BMI 25.12 kg/m    Objective:   Physical Exam Neck:   Cardiovascular:     Rate and Rhythm: Normal rate and regular rhythm.  Pulmonary:     Effort: Pulmonary effort is normal.     Breath sounds: Normal breath sounds.  Musculoskeletal:     Cervical back: Normal range of motion and neck supple. Tenderness present. No erythema or bony tenderness. Pain with movement and muscular tenderness present. No spinous process tenderness.     Thoracic back: Tenderness present. No bony tenderness. Normal range of motion.       Back:  Skin:    General: Skin is warm and dry.            Assessment & Plan:

## 2020-02-05 NOTE — Assessment & Plan Note (Signed)
Agree to MSK etiology, especially given her poor posture during her work day at [employer].  Discussed to work on posture, start Ibuprofen more routinely, use cyclobenzaprine 1/2 tablet as needed. She will continue with chiropractic services and update if no continued improvement or if she develops new symptoms.

## 2020-02-16 ENCOUNTER — Encounter: Payer: Self-pay | Admitting: Family Medicine

## 2020-02-16 ENCOUNTER — Ambulatory Visit: Payer: BC Managed Care – PPO | Admitting: Family Medicine

## 2020-02-16 ENCOUNTER — Other Ambulatory Visit: Payer: Self-pay

## 2020-02-16 VITALS — BP 90/60 | HR 83 | Temp 98.2°F | Ht 66.5 in | Wt 160.2 lb

## 2020-02-16 DIAGNOSIS — G2589 Other specified extrapyramidal and movement disorders: Secondary | ICD-10-CM

## 2020-02-16 DIAGNOSIS — M542 Cervicalgia: Secondary | ICD-10-CM | POA: Diagnosis not present

## 2020-02-16 MED ORDER — PREDNISONE 20 MG PO TABS: ORAL_TABLET | ORAL | 0 refills | Status: DC

## 2020-02-16 NOTE — Progress Notes (Signed)
Jennifer Jennifer [name] T. Jennifer Landgren, MD, Jennifer Jennifer [name]  Primary Care and [place] at [facility] [place], [address]  Phone: [phone]   FAX: [phone]  Jennifer Jennifer [name]   MRN [id number]   Date of Birth: 26-May-1996  Date: 02/16/2020   PCP: Pleas Koch, NP   Referral: Pleas Koch, NP  Chief Complaint  Patient presents with   Back Pain   Neck Pain    This visit occurred during the SARS-CoV-2 public health emergency.  Safety protocols were in place, including screening questions prior to the visit, additional usage of staff PPE, and extensive cleaning of exam room while observing appropriate contact time as indicated for disinfecting solutions.   Subjective:   Jennifer Jennifer [name] is a 24 y.o. very pleasant Jennifer [name] patient with Body mass index is 25.48 kg/m. who presents with the following:  F/u s/p OV with Mrs. Clark and Dr. Glori Bickers:  Upper back and neck pain:  R >>> L  Has seen chiro, nsaids, and flexeril without significant relief.  Per her report, there is some mild spondyloarthropathy at one level, but images are not available for my independent review.  She has been having some issues for about 2 months, and they have been getting somewhat worse.  Did have some tingling yesterday in an ulnar distribution in her right arm, but that is resolved as of today.  She has done some NSAIDs as well as Flexeril.  She has done some basic home rehab.  Her primary area of complaint is in the periscapular region in the middle to inferior scapular stabilizers.  She works in the [employer] as well as at home about 50% of the time.  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  Review McKenzie prot  Review of Systems is noted in the HPI, as appropriate   Objective:   BP 90/60    Pulse 83    Temp 98.2 F (36.8 C) (Temporal)    Ht 5' 6.5" (1.689 m)    Wt 160 lb 4 oz (72.7 kg)    LMP 02/11/2020     SpO2 98%    BMI 25.48 kg/m    GEN: Well-developed,well-nourished,in no acute distress; alert,appropriate and cooperative throughout examination HEENT: Normocephalic and atraumatic without obvious abnormalities. Ears, externally no deformities PULM: Breathing comfortably in no respiratory distress EXT: No clubbing, cyanosis, or edema PSYCH: Normally interactive. Cooperative during the interview. Pleasant. Friendly and conversant. Not anxious or depressed appearing. Normal, full affect.  CERVICAL SPINE EXAM Range of motion: Flexion, extension, lateral bending, and rotation: Full Pain with terminal motion: None Spinous Processes: NT SCM: NT Upper paracervical muscles: Mildly tender Upper traps: Tender to palpation at the middle trap and rhomboid. Tight pectoralis minor on exam Rounded scapula C5-T1 intact, sensation and motor   Shoulder: r Inspection: No muscle wasting or winging Ecchymosis/edema: neg  AC joint, scapula, clavicle: NT Abduction: full, 5/5 Flexion: full, 5/5 IR, full, lift-off: 5/5 ER at neutral: full, 5/5 AC crossover and compression: neg Neer: neg Hawkins: neg  Grip 5/5  Radiology: No results found.  Assessment and Plan:     ICD-10-CM   1. Acute neck pain  M54.2   2. 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  Primary issue  here appears to be scapular dyskinesis with posture that is not perfect and increased stress and strain on the predominantly rhomboids and lower traps with some pain on the right-sided periscapular region.  I think that this should be trainable and I gave the patient a rehab program from [place].  If she continues to have issues then formal physical therapy is entirely reasonable.  I asked her to call me in the next 2 weeks that she continues to have some issues with her rehab  program.  For now I am going to pulse her with some steroids.   Follow-up: Return in about 6 weeks (around 03/29/2020).  Meds ordered this encounter  Medications   predniSONE (DELTASONE) 20 MG tablet    Sig: 2 tabs po daily for 5 days, then 1 tab po daily for 5 days    Dispense:  15 tablet    Refill:  0   There are no discontinued medications. No orders of the defined types were placed in this encounter.   Signed,  Maud Deed. Geniene List, MD   Outpatient Encounter Medications as of 02/16/2020  Medication Sig   cyclobenzaprine (FLEXERIL) 10 MG tablet Take 0.5-1 tablets (5-10 mg total) by mouth at bedtime as needed for muscle spasms.   ibuprofen (ADVIL) 200 MG tablet Take 600 mg by mouth every 6 (six) hours as needed for moderate pain.    predniSONE (DELTASONE) 20 MG tablet 2 tabs po daily for 5 days, then 1 tab po daily for 5 days   No facility-administered encounter medications on file as of 02/16/2020.

## 2020-03-25 ENCOUNTER — Other Ambulatory Visit: Payer: Self-pay

## 2020-03-25 ENCOUNTER — Ambulatory Visit: Payer: BC Managed Care – PPO | Admitting: Family Medicine

## 2020-03-25 ENCOUNTER — Encounter: Payer: Self-pay | Admitting: Family Medicine

## 2020-03-25 VITALS — BP 102/70 | HR 72 | Temp 97.8°F | Ht 66.5 in | Wt 161.0 lb

## 2020-03-25 DIAGNOSIS — R350 Frequency of micturition: Secondary | ICD-10-CM | POA: Diagnosis not present

## 2020-03-25 LAB — POC URINALSYSI DIPSTICK (AUTOMATED)
Bilirubin, UA: NEGATIVE
Blood, UA: NEGATIVE
Glucose, UA: NEGATIVE
Ketones, UA: NEGATIVE
Leukocytes, UA: NEGATIVE
Nitrite, UA: NEGATIVE
Protein, UA: NEGATIVE
Spec Grav, UA: 1.015 (ref 1.010–1.025)
Urobilinogen, UA: 0.2 E.U./dL
pH, UA: 7 (ref 5.0–8.0)

## 2020-03-25 MED ORDER — NITROFURANTOIN MONOHYD MACRO 100 MG PO CAPS: 100.0000 mg | ORAL_CAPSULE | Freq: Two times a day (BID) | ORAL | 0 refills | Status: AC

## 2020-03-25 NOTE — Progress Notes (Signed)
Jawaan Adachi T. Asher Babilonia, MD, [place]  Primary Care and [place] at [facility] [place], [address]  Phone: [phone]  FAX: [phone]  CHELSY PARRALES - 24 y.o. female  MRN [id number]  Date of Birth: [birth year]  Date: 03/25/2020  PCP: Pleas Koch, NP  Referral: Pleas Koch, NP  Chief Complaint  Patient presents with  . Urinary Frequency  . Burning with Urination    This visit occurred during the SARS-CoV-2 public health emergency.  Safety protocols were in place, including screening questions prior to the visit, additional usage of staff PPE, and extensive cleaning of exam room while observing appropriate contact time as indicated for disinfecting solutions.   Subjective:   This 24 y.o. female patient presents with burning, urgency. No vaginal discharge or external irritation.  No STD exposure. + suprapubic abd pain   H/o pyelo and frequent UTI Recently had an E Coli uti with normal UA but + culture , no flank pain.  Review of Systems is noted in the HPI, as appropriate  Objective:   Blood pressure 102/70, pulse 72, temperature 97.8 F (36.6 C), temperature source Temporal, height 5' 6.5" (1.689 m), weight 161 lb (73 kg), last menstrual period 03/11/2020, SpO2 98 %.   GEN: WDWN HEENT: Atraumatc, normocephalic. CV: RRR, No M/G/R PULM: CTA B, No wheezes, crackles, or rhonchi ABD: S, NT, ND, +BS, no rebound. No CVAT. No suprapubic tenderness.  Objective Data: Results for orders placed or performed in visit on 03/25/20  POCT Urinalysis Dipstick (Automated)  Result Value Ref Range   Color, UA Yellow    Clarity, UA Hazy    Glucose, UA Negative Negative   Bilirubin, UA Negative    Ketones, UA Negative    Spec Grav, UA 1.015 1.010 - 1.025   Blood, UA Negative    pH, UA 7.0 5.0 - 8.0   Protein, UA Negative Negative   Urobilinogen, UA 0.2 0.2 or 1.0 E.U./dL   Nitrite, UA  Negative    Leukocytes, UA Negative Negative    Assessment and Plan:     ICD-10-CM   1. Urinary frequency  R35.0 POCT Urinalysis Dipstick (Automated)    Urine Culture   Clinically consistent with UTI, treat, f/u culture Rx with ABX as below. Drink plenty of fluids and supportive care.  Follow-up: No follow-ups on file.  Meds ordered this encounter  Medications  . nitrofurantoin, macrocrystal-monohydrate, (MACROBID) 100 MG capsule    Sig: Take 1 capsule (100 mg total) by mouth 2 (two) times daily for 7 days.    Dispense:  14 capsule    Refill:  0   Orders Placed This Encounter  Procedures  . Urine Culture  . POCT Urinalysis Dipstick (Automated)    Signed,  Lendora Keys T. Melchor Kirchgessner, MD   Patient's Medications  New Prescriptions   NITROFURANTOIN, MACROCRYSTAL-MONOHYDRATE, (MACROBID) 100 MG CAPSULE    Take 1 capsule (100 mg total) by mouth 2 (two) times daily for 7 days.  Previous Medications   IBUPROFEN (ADVIL) 200 MG TABLET    Take 600 mg by mouth every 6 (six) hours as needed for moderate pain.   Modified Medications   No medications on file  Discontinued Medications   CYCLOBENZAPRINE (FLEXERIL) 10 MG TABLET    Take 0.5-1 tablets (5-10 mg total) by mouth at bedtime as needed for muscle spasms.   PREDNISONE (DELTASONE) 20 MG TABLET    2 tabs po daily for 5  days, then 1 tab po daily for 5 days

## 2020-03-27 LAB — URINE CULTURE
MICRO NUMBER:: 11101648
SPECIMEN QUALITY:: ADEQUATE

## 2020-04-06 NOTE — Progress Notes (Addendum)
Jennifer Viscomi T. Jorita Bohanon, MD, [place]  Primary Care and [place] at [facility] [place], [address]  Phone: [phone]  FAX: [phone]  Jennifer [name] - 24 y.o. female  MRN [id number]  Date of Birth: [birth year]  Date: 04/07/2020  PCP: Pleas Koch, NP  Referral: Pleas Koch, NP  Chief Complaint  Patient presents with  . Back Pain    This visit occurred during the SARS-CoV-2 public health emergency.  Safety protocols were in place, including screening questions prior to the visit, additional usage of staff PPE, and extensive cleaning of exam room while observing appropriate contact time as indicated for disinfecting solutions.   Subjective:   Jennifer [name] is a 24 y.o. very pleasant female patient with Body mass index is 25.84 kg/m. who presents with the following:  She is a very pleasant young lady who presents with some ongoing back pain.  Previously did see her for some neck pain about 2 months ago.  At that point I thought that she was having some generalized cervicalgia as well as some scapular dyskinesis and weakness in the scapular stabilizers which was driving a lot of her pain.  I did give her a scapular stabilization program from [facility].  She is here today in follow-up. At this point, she has a neck pain ongoing for greater than 2 months.  She did have a flare several days ago, and this is never really gotten better at all. He does have pain with rotational movements of bending at the neck. She is also tried altering her sleeping patterns, and this does not completely resolve her symptoms as well.  03/25/2020 Last OV with Owens Loffler, MD  Upper back and neck pain:  R >>> L   Has seen chiro, nsaids, and flexeril without significant relief.  Per her report, there is some mild spondyloarthropathy at one level, but images are not available for my independent review.     She has been having some issues for about 2 months, and they have been getting somewhat worse.  Did have some tingling yesterday in an ulnar distribution in her right arm, but that is resolved as of today.  She has done some NSAIDs as well as Flexeril.  She has done some basic home rehab.   Her primary area of complaint is in the periscapular region in the middle to inferior scapular stabilizers.   She works in the [employer] as well as at home about 50% of the time.   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   Review McKenzie prot   Review of Systems is noted in the HPI, as appropriate   Objective:   BP 110/64   Pulse 78   Temp 97.8 F (36.6 C) (Temporal)   Ht 5' 6.5" (1.689 m)   Wt 162 lb 8 oz (73.7 kg)   LMP 03/11/2020 (Approximate)   SpO2 95%   BMI 25.84 kg/m    GEN: alert,appropriate PSYCH: Normally interactive. Cooperative during the interview.   CERVICAL SPINE EXAM Range of motion: Flexion, extension, lateral bending, and rotation: She does have some restricted motion globally, primarily in flexion and extension, mild overall Pain with terminal motion: Yes, all directions Spinous Processes: NT SCM: NT Upper paracervical muscles: Mildly tender to palpation Upper traps: She does have tenderness in the inferior traps C5-T1 intact, sensation and motor  Spurling's exam is negative  Radiology: X-rays from January 20, 2020 reviewed, the report. These are not available for my independent review. She does have some spondylopathy at C4-C5.   Assessment and Plan:     ICD-10-CM   1. Cervicalgia  M54.2 Ambulatory referral to Physical Therapy    MR Cervical Spine Wo Contrast  2. 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 Acute right-sided thoracic back pain  M54.6 Ambulatory referral to Physical Therapy  4. Numbness and tingling of upper extremity  R20.0 MR Cervical Spine Wo Contrast   R20.2    Total encounter time: 30 minutes. This  includes total time spent on the day of encounter. This includes chart review as well as face-to-face encounter. Ongoing cervicalgia approaching 3 months in duration without improvement despite chiropractic manipulation, steroids, NSAIDs, Tylenol, as well as muscle relaxants.  They get an appropriate next step would be to do some formal physical therapy with directed rehab and management. I did give her a home rehab program on her last office visit with attention to [facility] protocol. That did not really make much of a significant difference.  If she has no improvement despite physical therapy, then an appropriate next step would be an MRI of the cervical spine without contrast.  She did have some questions about other severe complications including underlying neoplasm. While this cannot be adequately seen on plain x-ray, the likelihood of this is exceedingly low. I did my best to reassure her.  Addendum: 04/20/20 9:24 AM The patient has called, and she continues to do poorly despite conservative measures for 3 months including, physical therapy, chiropractic care, steroids, NSAIDS, Tylenol, Muscle Relaxants, and home rehab, and she has normal cervical spine x-rays.  Obtain an MRI of the cervical spine to evaluate for disc herniation or foraminal stenosis in a setting of recalcitrant neck pain and radiculopathy. Electronically Signed  By: Owens Loffler, MD On: 04/20/2020 9:24 AM    No orders of the defined types were placed in this encounter.  There are no discontinued medications. Orders Placed This Encounter  Procedures  . MR Cervical Spine Wo Contrast  . Ambulatory referral to Physical Therapy    Follow-up: No follow-ups on file.  Signed,  Maud Deed. Samira Acero, MD   Outpatient Encounter Medications as of 04/07/2020  Medication Sig  . ibuprofen (ADVIL) 200 MG tablet Take 600 mg by mouth every 6 (six) hours as needed for moderate pain.    No facility-administered encounter  medications on file as of 04/07/2020.

## 2020-04-07 ENCOUNTER — Encounter: Payer: Self-pay | Admitting: Family Medicine

## 2020-04-07 ENCOUNTER — Ambulatory Visit: Payer: BC Managed Care – PPO | Admitting: Family Medicine

## 2020-04-07 ENCOUNTER — Other Ambulatory Visit: Payer: Self-pay

## 2020-04-07 VITALS — BP 110/64 | HR 78 | Temp 97.8°F | Ht 66.5 in | Wt 162.5 lb

## 2020-04-07 DIAGNOSIS — M542 Cervicalgia: Secondary | ICD-10-CM | POA: Diagnosis not present

## 2020-04-07 DIAGNOSIS — R200 Anesthesia of skin: Secondary | ICD-10-CM | POA: Diagnosis not present

## 2020-04-07 DIAGNOSIS — R202 Paresthesia of skin: Secondary | ICD-10-CM

## 2020-04-07 DIAGNOSIS — M546 Pain in thoracic spine: Secondary | ICD-10-CM | POA: Diagnosis not present

## 2020-04-07 DIAGNOSIS — G2589 Other specified extrapyramidal and movement disorders: Secondary | ICD-10-CM

## 2020-04-19 ENCOUNTER — Telehealth: Payer: Self-pay | Admitting: Primary Care

## 2020-04-19 NOTE — Telephone Encounter (Signed)
Pt called was told per Dr. Lorelei Pont that if she needed to get a MRI he can order one instead of coming back to be sen due to she was seen on 11/3

## 2020-04-20 NOTE — Addendum Note (Signed)
Addended by: Owens Loffler on: 04/20/2020 09:24 AM   Modules accepted: Orders

## 2020-04-20 NOTE — Telephone Encounter (Signed)
Ok, I put in the order.  She may get called directly from [place] before our office.

## 2020-04-20 NOTE — Telephone Encounter (Signed)
Scan approved through [facility] is scheduled 05/06/20  Nothing further needed.

## 2020-04-20 NOTE — Telephone Encounter (Signed)
[facility] notified as instructed by telephone.

## 2020-05-01 ENCOUNTER — Other Ambulatory Visit: Payer: Self-pay

## 2020-05-01 ENCOUNTER — Ambulatory Visit
Admission: RE | Admit: 2020-05-01 | Discharge: 2020-05-01 | Disposition: A | Discharge status: Home / Self Care | Payer: BC Managed Care – PPO | Source: Ambulatory Visit | Attending: Family Medicine | Admitting: Family Medicine

## 2020-05-01 DIAGNOSIS — R200 Anesthesia of skin: Secondary | ICD-10-CM | POA: Diagnosis not present

## 2020-05-01 DIAGNOSIS — M25511 Pain in right shoulder: Secondary | ICD-10-CM | POA: Diagnosis not present

## 2020-05-01 DIAGNOSIS — R202 Paresthesia of skin: Secondary | ICD-10-CM

## 2020-05-01 DIAGNOSIS — M5013 Cervical disc disorder with radiculopathy, cervicothoracic region: Secondary | ICD-10-CM | POA: Diagnosis not present

## 2020-05-01 DIAGNOSIS — M50122 Cervical disc disorder at C5-C6 level with radiculopathy: Secondary | ICD-10-CM | POA: Diagnosis not present

## 2020-05-01 DIAGNOSIS — M542 Cervicalgia: Secondary | ICD-10-CM

## 2020-05-08 ENCOUNTER — Other Ambulatory Visit: Payer: BC Managed Care – PPO

## 2020-05-27 DIAGNOSIS — U071 COVID-19: Secondary | ICD-10-CM | POA: Diagnosis not present

## 2020-05-27 DIAGNOSIS — J209 Acute bronchitis, unspecified: Secondary | ICD-10-CM | POA: Diagnosis not present

## 2020-07-01 DIAGNOSIS — Z6825 Body mass index (BMI) 25.0-25.9, adult: Secondary | ICD-10-CM | POA: Diagnosis not present

## 2020-07-01 DIAGNOSIS — R350 Frequency of micturition: Secondary | ICD-10-CM | POA: Diagnosis not present

## 2020-07-01 DIAGNOSIS — Z113 Encounter for screening for infections with a predominantly sexual mode of transmission: Secondary | ICD-10-CM | POA: Diagnosis not present

## 2020-07-01 DIAGNOSIS — Z01419 Encounter for gynecological examination (general) (routine) without abnormal findings: Secondary | ICD-10-CM | POA: Diagnosis not present

## 2020-07-25 IMAGING — CR DG ABDOMEN 1V
1 series · 1 of 1 positions shown · non-contrast
Comparison: CT 12/24/2017

CLINICAL DATA: Follow up right-sided kidney stones.

EXAM:
ABDOMEN - 1 VIEW

[abdomen kub]
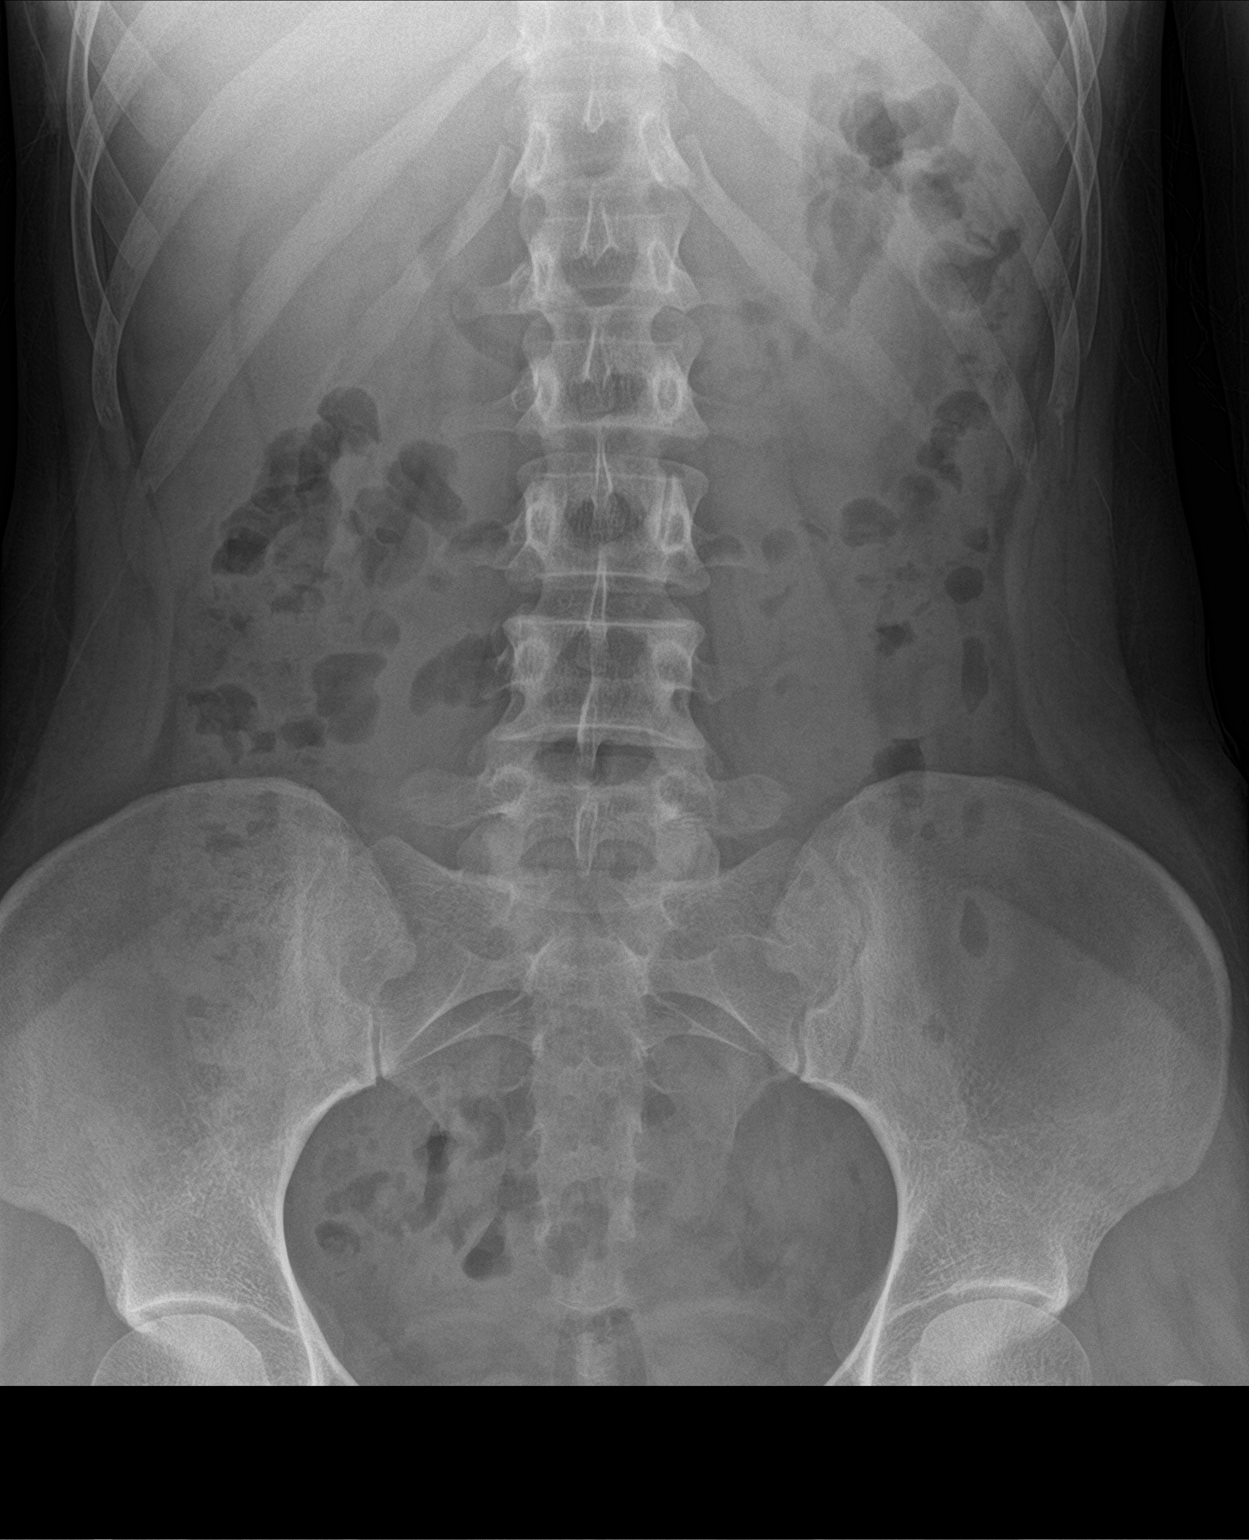

[1 of 1 positions shown; findings below may reference images not displayed]

FINDINGS: Gas pattern is normal. 5 mm stone in the distal right ureter as
shown better on previous CT. No abnormal bone finding.
IMPRESSION: Redemonstration of 5 mm stone in the distal right ureter just
proximal to the UVJ.

## 2020-07-27 DIAGNOSIS — R87612 Low grade squamous intraepithelial lesion on cytologic smear of cervix (LGSIL): Secondary | ICD-10-CM | POA: Diagnosis not present

## 2020-08-10 DIAGNOSIS — R87612 Low grade squamous intraepithelial lesion on cytologic smear of cervix (LGSIL): Secondary | ICD-10-CM | POA: Diagnosis not present

## 2020-08-10 DIAGNOSIS — Z3202 Encounter for pregnancy test, result negative: Secondary | ICD-10-CM | POA: Diagnosis not present

## 2020-09-21 ENCOUNTER — Encounter: Payer: Self-pay | Admitting: Family Medicine

## 2020-09-21 ENCOUNTER — Other Ambulatory Visit: Payer: Self-pay

## 2020-09-21 ENCOUNTER — Ambulatory Visit (INDEPENDENT_AMBULATORY_CARE_PROVIDER_SITE_OTHER): Payer: 59 | Admitting: Family Medicine

## 2020-09-21 VITALS — BP 108/68 | HR 81 | Temp 97.7°F | Ht 66.5 in | Wt 163.2 lb

## 2020-09-21 DIAGNOSIS — R300 Dysuria: Secondary | ICD-10-CM | POA: Insufficient documentation

## 2020-09-21 LAB — POC URINALSYSI DIPSTICK (AUTOMATED)
Bilirubin, UA: NEGATIVE
Glucose, UA: NEGATIVE
Leukocytes, UA: NEGATIVE
Nitrite, UA: NEGATIVE
Protein, UA: NEGATIVE
Spec Grav, UA: 1.025 (ref 1.010–1.025)
Urobilinogen, UA: 0.2 E.U./dL
pH, UA: 6 (ref 5.0–8.0)

## 2020-09-21 MED ORDER — NITROFURANTOIN MONOHYD MACRO 100 MG PO CAPS: 100.0000 mg | ORAL_CAPSULE | Freq: Two times a day (BID) | ORAL | 0 refills | Status: AC

## 2020-09-21 NOTE — Patient Instructions (Addendum)
Urine showing some blood but otherwise ok - ?period related.  Urine culture sent and we will be in touch with results.  In the meantime, limit bladder irritants (like caffeine, dark sodas and spicy foods), increase water, try cranberry juice as well.  May use tylenol for discomfort.

## 2020-09-21 NOTE — Assessment & Plan Note (Signed)
Possible UTI in setting of period. UA with blood however currently on period. Microscopy without obvious infection. Culture sent. Rx macrobid to pharmacy in case symptoms worsen while we await UCx results. Discussed possible ddx including recurrent kidney stone, interstitial cystitis. Encouraged limiting bladder irritants, discussed increased water, tylenol use, cranberry juice. Update if worsening. If UCx negative, she would be willing to return to see urology for further evaluation.

## 2020-09-21 NOTE — Progress Notes (Addendum)
Patient ID: Jennifer [name], female    DOB: 1995-12-01, 25 y.o.   MRN: [id number]  This visit was conducted in person.  BP 108/68   Pulse 81   Temp 97.7 F (36.5 C) (Temporal)   Ht 5' 6.5" (1.689 m)   Wt 163 lb 3 oz (74 kg)   LMP 09/18/2020   SpO2 97%   BMI 25.94 kg/m    CC: UTI? Subjective:   HPI: Jennifer [name] is a 25 y.o. female presenting on 09/21/2020 for Dysuria (C/o burning when urinating, frequency abd discomfort.  Sxs started 09/16/20. )   Having trouble with recurrent UTIs.  6d h/o urinary frequency with urgency, dysuria, lower abd pain.   No fevers/chills, flank pain, nausea/vomiting. Not sure about blood in urine as currently on period.  No recent abx use.   H/o kidney stone 4 years ago needed lithotripsy. Asks about natural kidney stone breaker supplement.   UTI 12/2019 E coli treated with macrobid UTI 03/2020 staph saprophytus treated with macrobid     Relevant past medical, surgical, family and social history reviewed and updated as indicated. Interim medical history since our last visit reviewed. Allergies and medications reviewed and updated. Outpatient Medications Prior to Visit  Medication Sig Dispense Refill  . ibuprofen (ADVIL) 200 MG tablet Take 600 mg by mouth every 6 (six) hours as needed for moderate pain.      No facility-administered medications prior to visit.     Per HPI unless specifically indicated in ROS section below Review of Systems Objective:  BP 108/68   Pulse 81   Temp 97.7 F (36.5 C) (Temporal)   Ht 5' 6.5" (1.689 m)   Wt 163 lb 3 oz (74 kg)   LMP 09/18/2020   SpO2 97%   BMI 25.94 kg/m   Wt Readings from Last 3 Encounters:  09/21/20 163 lb 3 oz (74 kg)  04/07/20 162 lb 8 oz (73.7 kg)  03/25/20 161 lb (73 kg)      Physical Exam Vitals and nursing note reviewed.  Constitutional:      Appearance: Normal appearance. She is not ill-appearing.  Abdominal:     General: Abdomen is flat. Bowel sounds are  normal. There is no distension.     Palpations: Abdomen is soft. There is no mass.     Tenderness: There is abdominal tenderness (mild) in the suprapubic area. There is no right CVA tenderness, left CVA tenderness, guarding or rebound.     Hernia: No hernia is present.  Neurological:     Mental Status: She is alert.  Psychiatric:        Mood and Affect: Mood normal.        Behavior: Behavior normal.       Results for orders placed or performed in visit on 09/21/20  POCT Urinalysis Dipstick (Automated)  Result Value Ref Range   Color, UA yellow    Clarity, UA cloudy    Glucose, UA Negative Negative   Bilirubin, UA negative    Ketones, UA +/-    Spec Grav, UA 1.025 1.010 - 1.025   Blood, UA 3+    pH, UA 6.0 5.0 - 8.0   Protein, UA Negative Negative   Urobilinogen, UA 0.2 0.2 or 1.0 E.U./dL   Nitrite, UA negative    Leukocytes, UA Negative Negative   Assessment & Plan:  This visit occurred during the SARS-CoV-2 public health emergency.  Safety protocols were in place, including screening questions prior to  the visit, additional usage of staff PPE, and extensive cleaning of exam room while observing appropriate contact time as indicated for disinfecting solutions.   Problem List Items Addressed This Visit    Dysuria - Primary    Possible UTI in setting of period. UA with blood however currently on period. Microscopy without obvious infection. Culture sent. Rx macrobid to pharmacy in case symptoms worsen while we await UCx results. Discussed possible ddx including recurrent kidney stone, interstitial cystitis. Encouraged limiting bladder irritants, discussed increased water, tylenol use, cranberry juice. Update if worsening. If UCx negative, she would be willing to return to see urology for further evaluation.       Relevant Orders   POCT Urinalysis Dipstick (Automated) (Completed)   Urine Culture       Meds ordered this encounter  Medications  . nitrofurantoin,  macrocrystal-monohydrate, (MACROBID) 100 MG capsule    Sig: Take 1 capsule (100 mg total) by mouth 2 (two) times daily for 7 days.    Dispense:  14 capsule    Refill:  0   Orders Placed This Encounter  Procedures  . Urine Culture  . POCT Urinalysis Dipstick (Automated)    Patient Instructions  Urine showing some blood but otherwise ok - ?period related.  Urine culture sent and we will be in touch with results.  In the meantime, limit bladder irritants (like caffeine, dark sodas and spicy foods), increase water, try cranberry juice as well.  May use tylenol for discomfort.    Follow up plan: Return if symptoms worsen or fail to improve.  Ria Bush, MD

## 2020-09-22 LAB — URINE CULTURE
MICRO NUMBER:: 11787046
SPECIMEN QUALITY:: ADEQUATE

## 2020-09-23 ENCOUNTER — Other Ambulatory Visit: Payer: Self-pay | Admitting: Family Medicine

## 2020-09-23 DIAGNOSIS — R300 Dysuria: Secondary | ICD-10-CM

## 2020-09-23 DIAGNOSIS — Z87442 Personal history of urinary calculi: Secondary | ICD-10-CM | POA: Insufficient documentation

## 2020-11-02 ENCOUNTER — Other Ambulatory Visit (INDEPENDENT_AMBULATORY_CARE_PROVIDER_SITE_OTHER): Payer: 59

## 2020-11-02 ENCOUNTER — Encounter: Payer: Self-pay | Admitting: Internal Medicine

## 2020-11-02 ENCOUNTER — Other Ambulatory Visit: Payer: Self-pay

## 2020-11-02 ENCOUNTER — Telehealth (INDEPENDENT_AMBULATORY_CARE_PROVIDER_SITE_OTHER): Payer: 59 | Admitting: Internal Medicine

## 2020-11-02 VITALS — Temp 99.0°F

## 2020-11-02 DIAGNOSIS — J039 Acute tonsillitis, unspecified: Secondary | ICD-10-CM

## 2020-11-02 LAB — POCT RAPID STREP A (OFFICE): Rapid Strep A Screen: NEGATIVE

## 2020-11-02 NOTE — Assessment & Plan Note (Signed)
She has systemic symptoms and past strep High contact job--was there the day before symptoms Discussed options---empiric PCN reasonable (I estimate 10-20% chance of strep) She would like to do testing Will do COVID and strep If strep positive---PCN VK 500mg  po bid x 10 days If COVID negative--can return to work when symptoms better---wait for next week if positive Increase ibuprofen and can try other symptom reliefs (gargle, sprays, etc)

## 2020-11-02 NOTE — Progress Notes (Signed)
Subjective:    Patient ID: Jennifer [name], female    DOB: 1995/09/10, 25 y.o.   MRN: [id number]  HPI Video virtual visit due to respiratory symptoms Identification done Reviewed limitations and billing and she gave consent Participants--patient in her home and I am in my office  Started yesterday with minor sore throat on her right side Worsened progressively through the night Now into right ear Some chills and fever up to 101.5 Full body pain and weakness Hard to swallow and talk Looks "discolored, almost purple" on the right No cough or SOB  Tried ibuprofen 200mg  twice---may have helped last night  Did home COVID tests yesterday and today--both negative  Current Outpatient Medications on File Prior to Visit  Medication Sig Dispense Refill  . ibuprofen (ADVIL) 200 MG tablet Take 600 mg by mouth every 6 (six) hours as needed for moderate pain.      No current facility-administered medications on file prior to visit.    Allergies  Allergen Reactions  . Sulfa Antibiotics Hives    Past Medical History:  Diagnosis Date  . Irregular periods   . Kidney stone on right side 12/24/2017  . Strabismus     Past Surgical History:  Procedure Laterality Date  . EXTRACORPOREAL SHOCK WAVE LITHOTRIPSY Right 01/03/2018   Procedure: EXTRACORPOREAL SHOCK WAVE LITHOTRIPSY (ESWL);  Surgeon: Abbie Sons, MD;  Location: [facility] ORS;  Service: Urology;  Laterality: Right;  . EYE SURGERY    . WISDOM TOOTH EXTRACTION      Family History  Problem Relation Age of Onset  . Alcohol abuse Mother     Social History   Socioeconomic History  . Marital status: Single    Spouse name: Not on file  . Number of children: Not on file  . Years of education: Not on file  . Highest education level: Not on file  Occupational History  . Not on file  Tobacco Use  . Smoking status: Former Smoker    Quit date: 03/20/2015    Years since quitting: 5.6  . Smokeless tobacco: Never Used   Vaping Use  . Vaping Use: Never used  Substance and Sexual Activity  . Alcohol use: Yes    Alcohol/week: 0.0 standard drinks    Comment: occ  . Drug use: No  . Sexual activity: Yes  Other Topics Concern  . Not on file  Social History Narrative   Attending [employer] in the fall.   Sexually active, uses condoms.   Lives with dad.   Mom is recovering alcoholic.   Has good support system- both grandmothers live near by.     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  HENT:     Mouth/Throat:     Comments: Some asymmetric right tonsillar enlargement Mild erythema  No asymmetry to suggest abscess Neck:     Comments: She feels swollen glands  Neurological:     Mental Status: She is alert.  Assessment & Plan:

## 2020-11-03 LAB — NOVEL CORONAVIRUS, NAA: SARS-CoV-2, NAA: NOT DETECTED

## 2020-11-03 LAB — SARS-COV-2, NAA 2 DAY TAT

## 2020-11-23 ENCOUNTER — Telehealth: Payer: Self-pay

## 2020-11-23 NOTE — Telephone Encounter (Signed)
Patient called due to having issues with her right ear-some pain, itching, post nasal drip at times, slight sore throat at times from drainage. No fever, no headache, no congestion. No other symptoms.  No openings with anyone until Friday with Dr Silvio Pate. Patient is scheduled for then. IS it ok to be in person and also sending to PCP to see if she can work patient in sooner possibly?

## 2020-11-23 NOTE — Telephone Encounter (Signed)
I cannot work her in this week, my schedule is packed to the max. I am very appreciative of Dr. Silvio Pate seeing her.

## 2020-11-24 ENCOUNTER — Encounter: Payer: Self-pay | Admitting: Primary Care

## 2020-11-24 NOTE — Telephone Encounter (Signed)
Jennifer [name] called in returning  message from [name]. Relaed the message and she is going to come today at 145. And explained what she has to do.

## 2020-11-24 NOTE — Telephone Encounter (Signed)
If she prefers tomorrow--I can add her at 1:45PM

## 2020-11-24 NOTE — Telephone Encounter (Addendum)
Left message on VM to call office to schedule appt. Please provide her the [facility] Phone number and ask her to come to the EMS/Employee Entrance Door.

## 2020-11-25 ENCOUNTER — Other Ambulatory Visit: Payer: Self-pay

## 2020-11-25 ENCOUNTER — Encounter: Payer: Self-pay | Admitting: Internal Medicine

## 2020-11-25 ENCOUNTER — Ambulatory Visit (INDEPENDENT_AMBULATORY_CARE_PROVIDER_SITE_OTHER): Payer: 59 | Admitting: Internal Medicine

## 2020-11-25 DIAGNOSIS — H9201 Otalgia, right ear: Secondary | ICD-10-CM | POA: Insufficient documentation

## 2020-11-25 NOTE — Progress Notes (Signed)
Subjective:    Patient ID: Jennifer [name], female    DOB: Oct 16, 1995, 25 y.o.   MRN: [id number]  HPI Here due to ongoing respiratory symptoms This visit occurred during the SARS-CoV-2 public health emergency.  Safety protocols were in place, including screening questions prior to the visit, additional usage of staff PPE, and extensive cleaning of exam room while observing appropriate contact time as indicated for disinfecting solutions.   Concerned due worsening right ear pain Sore throat mostly better--but still bothers her at night Feeling some popping in right ear---and discharge from ear (orange)  No fever No sig cough Has hard big bump in right nare---long standing (not sure it is related) No headache  Current Outpatient Medications on File Prior to Visit  Medication Sig Dispense Refill   Cranberry 125 MG TABS Take 2 tablets by mouth in the morning and at bedtime.     ibuprofen (ADVIL) 200 MG tablet Take 600 mg by mouth every 6 (six) hours as needed for moderate pain.      No current facility-administered medications on file prior to visit.    Allergies  Allergen Reactions   Sulfa Antibiotics Hives    Past Medical History:  Diagnosis Date   Irregular periods    Kidney stone on right side 12/24/2017   Strabismus     Past Surgical History:  Procedure Laterality Date   EXTRACORPOREAL SHOCK WAVE LITHOTRIPSY Right 01/03/2018   Procedure: EXTRACORPOREAL SHOCK WAVE LITHOTRIPSY (ESWL);  Surgeon: Abbie Sons, MD;  Location: [facility] ORS;  Service: Urology;  Laterality: Right;   EYE SURGERY     WISDOM TOOTH EXTRACTION      Family History  Problem Relation Age of Onset   Alcohol abuse Mother    Kidney failure Father     Social History   Socioeconomic History   Marital status: Single    Spouse name: Not on file   Number of children: Not on file   Years of education: Not on file   Highest education level: Not on file  Occupational History   Not on file   Tobacco Use   Smoking status: Former    Pack years: 0.00    Types: Cigarettes    Quit date: 03/20/2015    Years since quitting: 5.6   Smokeless tobacco: Never  Vaping Use   Vaping Use: Never used  Substance and Sexual Activity   Alcohol use: Yes    Alcohol/week: 0.0 standard drinks    Comment: occ   Drug use: No   Sexual activity: Yes  Other Topics Concern   Not on file  Social History Narrative   Attending [employer] in the fall.   Sexually active, uses condoms.   Lives with dad.   Mom is recovering alcoholic.   Has good support system- both grandmothers live near by.     Social Determinants of Health   Financial Resource Strain: Not on file  Food Insecurity: Not on file  Transportation Needs: Not on file  Physical Activity: Not on file  Stress: Not on file  Social Connections: Not on file  Intimate Partner Violence: Not on file   Review of Systems Not really an allergy sufferer Mostly feels back to normal    Objective:   Physical Exam Constitutional:      Appearance: Normal appearance.  HENT:     Right Ear: Tympanic membrane and ear canal normal.     Left Ear: Tympanic membrane and ear canal normal.  Nose:     Comments: Moderate congestion ?small polyps bilaterally No mass seen or felt in her area of concern    Mouth/Throat:     Pharynx: No oropharyngeal exudate or posterior oropharyngeal erythema.     Comments: 2+ non inflamed tonsils Pulmonary:     Effort: Pulmonary effort is normal.     Breath sounds: Normal breath sounds.  Neurological:     Mental Status: She is alert.           Assessment & Plan:

## 2020-11-25 NOTE — Assessment & Plan Note (Signed)
No evidence of infection No allergy symptoms Likely related to Eustachian tube---recommended ENT evaluation if ongoing ear symptoms or any trouble with nose

## 2020-11-26 ENCOUNTER — Ambulatory Visit: Payer: 59 | Admitting: Internal Medicine

## 2021-02-04 ENCOUNTER — Telehealth: Payer: Self-pay

## 2021-02-04 ENCOUNTER — Emergency Department (HOSPITAL_BASED_OUTPATIENT_CLINIC_OR_DEPARTMENT_OTHER): Payer: BC Managed Care – PPO

## 2021-02-04 ENCOUNTER — Other Ambulatory Visit: Payer: Self-pay

## 2021-02-04 ENCOUNTER — Encounter (HOSPITAL_BASED_OUTPATIENT_CLINIC_OR_DEPARTMENT_OTHER): Payer: Self-pay | Admitting: Emergency Medicine

## 2021-02-04 ENCOUNTER — Emergency Department (HOSPITAL_BASED_OUTPATIENT_CLINIC_OR_DEPARTMENT_OTHER)
Admission: EM | Admit: 2021-02-04 | Discharge: 2021-02-04 | Disposition: A | Discharge status: Home / Self Care | Payer: BC Managed Care – PPO | Attending: Emergency Medicine | Admitting: Emergency Medicine

## 2021-02-04 DIAGNOSIS — N200 Calculus of kidney: Secondary | ICD-10-CM | POA: Diagnosis not present

## 2021-02-04 DIAGNOSIS — N3289 Other specified disorders of bladder: Secondary | ICD-10-CM | POA: Diagnosis not present

## 2021-02-04 DIAGNOSIS — Z87891 Personal history of nicotine dependence: Secondary | ICD-10-CM | POA: Diagnosis not present

## 2021-02-04 DIAGNOSIS — R109 Unspecified abdominal pain: Secondary | ICD-10-CM | POA: Diagnosis not present

## 2021-02-04 LAB — PREGNANCY, URINE: Preg Test, Ur: NEGATIVE

## 2021-02-04 LAB — URINALYSIS, ROUTINE W REFLEX MICROSCOPIC
Bilirubin Urine: NEGATIVE
Glucose, UA: NEGATIVE mg/dL
Hgb urine dipstick: NEGATIVE
Ketones, ur: NEGATIVE mg/dL
Leukocytes,Ua: NEGATIVE
Nitrite: NEGATIVE
Protein, ur: NEGATIVE mg/dL
Specific Gravity, Urine: 1.008 (ref 1.005–1.030)
pH: 6 (ref 5.0–8.0)

## 2021-02-04 MED ORDER — CEPHALEXIN 500 MG PO CAPS: 500.0000 mg | ORAL_CAPSULE | Freq: Four times a day (QID) | ORAL | 0 refills | Status: DC

## 2021-02-04 NOTE — Telephone Encounter (Signed)
Noted. Will await notes.  

## 2021-02-04 NOTE — Telephone Encounter (Signed)
[facility] - Client TELEPHONE ADVICE RECORD [facility] Patient Name: Jennifer [name] Gender: Female DOB: Oct 18, 1995 Age: 25 Y 22 M 11 D Return Phone Number: [phone] (Primary) Address: City/ State/ Zip: [place] [address] Client [facility] - Client Client Site [facility] - Day Physician Alma Friendly - NP Contact Type Call Who Is Calling Patient / Member / Family / Caregiver Call Type Triage / Clinical Relationship To Patient Self Return Phone Number [phone] (Primary) Chief Complaint Back Pain - General Reason for Call Symptomatic / Request for [facility] states she is experiencing back pain. Translation No Nurse Assessment Nurse: D'Heur Lucia Gaskins, RN, [place] Date/Time (Eastern Time): 02/04/2021 8:41:08 AM Confirm and document reason for call. If symptomatic, describe symptoms. ---Caller states she is experiencing severe back and left flank pain. She first noticed it this morning. No fever. Pain is constant and burning & aching, sharp at times. Does the patient have any new or worsening symptoms? ---Yes Will a triage be completed? ---Yes Related visit to physician within the last 2 weeks? ---No Does the PT have any chronic conditions? (i.e. diabetes, asthma, this includes High risk factors for pregnancy, etc.) ---No Is the patient pregnant or possibly pregnant? (Ask all females between the ages of 39-55) ---No Is this a behavioral health or substance abuse call? ---No Guidelines Guideline Title Affirmed Question Affirmed Notes Nurse Date/Time (Eastern Time) Flank Pain MODERATE pain (e.g., interferes with normal activities or awakens from sleep) Fowler, RN, [place] 02/04/2021 8:43:25 AM Disp. Time Eilene Ghazi Time) Disposition Final User 02/04/2021 8:47:41 AM See PCP within 24 Hours Yes D'Heur Lucia Gaskins, RN, Vincente Liberty PLEASE NOTE:  All timestamps contained within this report are represented as [place] Standard Time. CONFIDENTIALTY NOTICE: This fax transmission is intended only for the addressee. It contains information that is legally privileged, confidential or otherwise protected from use or disclosure. If you are not the intended recipient, you are strictly prohibited from reviewing, disclosing, copying using or disseminating any of this information or taking any action in reliance on or regarding this information. If you have received this fax in error, please notify us immediately by telephone so that we can arrange for its return to [place]. Phone: [phone], Toll-Free: [phone], Fax: [phone] Page: 2 of 2 Call Id: [id number] Caller Disagree/Comply Comply Caller Understands Yes PreDisposition Call Doctor Care Advice Given Per Guideline SEE PCP WITHIN 24 HOURS: * IF OFFICE WILL BE OPEN: You need to be examined within the next 24 hours. Call your doctor (or NP/PA) when the office opens and make an appointment. PAIN MEDICINES: * For pain relief, you can take either acetaminophen, ibuprofen, or naproxen. CALL BACK IF: * Fever over 100.4 F (38.0 C) * You become worse CARE ADVICE given per Flank Pain (Adult) guideline. Comments User: Vincente Liberty, D'Heur Lucia Gaskins, RN Date/Time Eilene Ghazi Time): 02/04/2021 8:43:18 AM Caller has hx of kidney stone. User: Vincente Liberty, D'Heur Lucia Gaskins, RN Date/Time Eilene Ghazi Time): 02/04/2021 8:44:06 AM Pain started about 3 hours ago. User: Vincente Liberty, D'Heur Lucia Gaskins, RN Date/Time Eilene Ghazi Time): 02/04/2021 8:44:37 AM Patient also is having urethral pain. User: Vincente Liberty, D'Heur Lucia Gaskins, RN Date/Time Eilene Ghazi Time): 02/04/2021 8:46:47 AM Caller feels she may be having some urinary retention. Referrals REFERRED TO PCP OFFICE

## 2021-02-04 NOTE — Telephone Encounter (Signed)
I spoke with pt's grandmother and she has not spoken with pt today; pt only has one contact # and pts grandmother will have pt call [facility] if grandmother talks with pt today. I called pt again and pt answered phone; pt is presently at [facility] [address]. Sending note to Gentry Fitz NP.

## 2021-02-04 NOTE — ED Triage Notes (Signed)
Pt presents to ED POV. Pt c/o L flank pain. Sent from UC d/t blood in urine. Hx kidney stones.

## 2021-02-04 NOTE — Discharge Instructions (Addendum)
We saw you in the ER for the abdominal pain. As discussed, the work-up is concerning for kidney stone at this time.  Take ibuprofen for pain control. Set up an appointment with the Urologist -Dr. Caryl Pina, who you saw before or Dr. Gilford Rile who is the on-call urologist today.  If the pain is unbearable, you start having fevers, chills, and are unable to keep any meds down - then return to the ER.

## 2021-02-04 NOTE — ED Notes (Signed)
States has left flank pain and states it feels like her kidneys.  Has some frequency.  Denies nausea or vomiting;  Denies abdominal pain.

## 2021-02-04 NOTE — ED Provider Notes (Signed)
[facility] EMERGENCY DEPT Provider Note   CSN: [id number] Arrival date & time: 02/04/21  1143     History Chief Complaint  Patient presents with   Flank Pain    Jennifer [name] is a 25 y.o. female.  HPI    25 year old comes in a chief complaint of flank pain.  Patient has history of right-sided kidney stone that required lithotripsy in 2019.  She comes to the ER with left-sided flank pain.  Patient has been having left-sided flank pain, constantly since yesterday.  The pain is described as dull pain mostly.  There is some sharp intermittent quality to the pain.  Pain is similar to her prior kidney stone pain.  She also reports having mild urinary frequency without any pain with urination or blood in the urine.  She went to urgent care prior to ED arrival and was told that there was some blood in her urine.  Patient denies any vaginal discharge, bleeding and had a recent STI checkup which was normal.  She has a single partner at this time.  She denies any abdominal pain.  She has had UTI and kidney infection in the past, including when she had the kidney stone.  She is unsure if the current symptoms are like her UTI or not.  Past Medical History:  Diagnosis Date   Irregular periods    Kidney stone on right side 12/24/2017   Strabismus     Patient Active Problem List   Diagnosis Date Noted   Right ear pain 11/25/2020   History of kidney stones 09/23/2020   Engages in [place] 01/20/2020   Thoracic back pain 01/20/2020   Suprapubic pain 12/23/2019   Dysuria 12/22/2019   Hordeolum externum of left lower eyelid 03/17/2019   Skin irritation 06/20/2018   Fatigue 03/25/2018   Tonsillitis 10/01/2015   Irregular periods 10/11/2010    Past Surgical History:  Procedure Laterality Date   EXTRACORPOREAL SHOCK WAVE LITHOTRIPSY Right 01/03/2018   Procedure: EXTRACORPOREAL SHOCK WAVE LITHOTRIPSY (ESWL);  Surgeon: Abbie Sons, MD;  Location: [facility] ORS;  Service:  Urology;  Laterality: Right;   EYE SURGERY     WISDOM TOOTH EXTRACTION       OB History   No obstetric history on file.     Family History  Problem Relation Age of Onset   Alcohol abuse Mother    Kidney failure Father     Social History   Tobacco Use   Smoking status: Former    Types: Cigarettes    Quit date: 03/20/2015    Years since quitting: 5.8   Smokeless tobacco: Never  Vaping Use   Vaping Use: Never used  Substance Use Topics   Alcohol use: Yes    Alcohol/week: 0.0 standard drinks    Comment: occ   Drug use: No    Home Medications Prior to Admission medications   Medication Sig Start Date End Date Taking? Authorizing Provider  cephALEXin (KEFLEX) 500 MG capsule Take 1 capsule (500 mg total) by mouth 4 (four) times daily. 02/04/21  Yes Varney Biles, MD  Cranberry 125 MG TABS Take 2 tablets by mouth in the morning and at bedtime.    [provider]  ibuprofen (ADVIL) 200 MG tablet Take 600 mg by mouth every 6 (six) hours as needed for moderate pain.     [provider]    Allergies    Sulfa antibiotics  Review of Systems   Review of Systems  Constitutional:  Positive for  activity change.  Gastrointestinal:  Negative for nausea and vomiting.  Genitourinary:  Positive for flank pain. Negative for dysuria.  Allergic/Immunologic: Negative for immunocompromised state.  All other systems reviewed and are negative.  Physical Exam Updated Vital Signs BP 112/61 (BP Location: Right Arm)   Pulse 72   Temp 98.7 F (37.1 C)   Resp 18   Ht '5\' 7"'$  (1.702 m)   Wt 70.8 kg   LMP 01/30/2021   SpO2 100%   BMI 24.43 kg/m   Physical Exam Vitals and nursing note reviewed.  Constitutional:      Appearance: She is well-developed.  HENT:     Head: Atraumatic.  Cardiovascular:     Rate and Rhythm: Normal rate.  Pulmonary:     Effort: Pulmonary effort is normal.  Abdominal:     Tenderness: There is no abdominal tenderness.     Comments: No  reproducible flank tenderness  Musculoskeletal:     Cervical back: Normal range of motion and neck supple.  Skin:    General: Skin is warm and dry.  Neurological:     Mental Status: She is alert and oriented to person, place, and time.    ED Results / Procedures / Treatments   Labs (all labs ordered are listed, but only abnormal results are displayed) Labs Reviewed  URINALYSIS, ROUTINE W REFLEX MICROSCOPIC - Abnormal; Notable for the following components:      Result Value   Color, Urine COLORLESS (*)    All other components within normal limits  PREGNANCY, URINE    EKG None  Radiology US Renal  Result Date: 02/04/2021 CLINICAL DATA:  Left flank pain, history of right-sided stone EXAM: RENAL / URINARY TRACT ULTRASOUND COMPLETE COMPARISON:  CT abdomen/pelvis 11/28/2018, renal ultrasound 01/30/2018 FINDINGS: Right Kidney: Renal measurements: 10.3 cm x 5.2 cm x 4.4 cm = volume: 122 mL. Echogenicity within normal limits. No mass or hydronephrosis visualized. Left Kidney: Renal measurements: 11.9 cm x 6.7 cm x 5.5 cm = volume: 229 mL. There is mild fullness of the left collecting system without frank hydronephrosis. No obstructing lesion or stone is seen. Bladder: Appears normal for degree of bladder distention. Both ureteral jets are seen. Other: None. IMPRESSION: Mild fullness of the left collecting system without frank hydronephrosis; no obstructing lesion or stone is seen. The left ureteral jet was identified in the bladder. Electronically Signed   By: Valetta Mole M.D.   On: 02/04/2021 15:27    Procedures Procedures   Medications Ordered in ED Medications - No data to display  ED Course  I have reviewed the triage vital signs and the nursing notes.  Pertinent labs & imaging results that were available during my care of the patient were reviewed by me and considered in my medical decision making (see chart for details).    MDM Rules/Calculators/A&P                             25 year old female comes in with chief complaint of flank pain.  She has history of ureteral colic that required lithotripsy in 2019.  Patient was sent to the ER from urgent care because of hematuria and history of kidney stones.  She has no known history of pelvic disorder and denies any vaginal discharge, bleeding and denies any high risk STI behavior.  Doubt that this is PID.  Doubt ovarian torsion.  LMP was a few days back, will make sure your pregnancy test  is negative.  She has had UTI and kidney infection, but does not quite remember the symptoms with it.  At this time she does not have burning with urination and no significant urinary frequency either.  Low suspicion for UTI clinically, UA has been sent.  After lengthy discussion with the patient, we have agreed to be conservative and not get a CT scan.  Pain is tolerable at this time, will get ultrasound and reassess  3:58 PM Ultrasound is indicative of left-sided collecting duct fullness without any hydronephrosis.  Suspicion is that patient could have nephrolithiasis versus a small stone at his still not causing enough obstruction leading to hydronephrosis.  Results discussed with the patient and the plan is for [place] to manage it like a kidney stone and for her to contact her urologist for an outpatient follow-up.  She will return to the ER if her symptoms get worse.  She is still unsure about UTI -and given the ultrasound is equivocal for kidney stone as well, we will give her Keflex antibiotic prescription that she will fill only if she starts having some urinary symptoms or fevers.  Final Clinical Impression(s) / ED Diagnoses Final diagnoses:  Nephrolithiasis    Rx / DC Orders ED Discharge Orders          Ordered    cephALEXin (KEFLEX) 500 MG capsule  4 times daily        02/04/21 1551             Varney Biles, MD 02/04/21 1600

## 2021-02-04 NOTE — Telephone Encounter (Signed)
[facility] Night - Client Nonclinical Telephone Record AccessNurse Client [facility] Night - Client Client Site [place] Physician Alma Friendly - NP Contact Type Call Who Is Calling Patient / Member / Family / Caregiver Caller Name Altagracia Felgar Caller Phone Number [phone] Patient Name Jennifer [name] Patient DOB [birth year] Call Type Message Only Information Provided Reason for Call Request to Schedule Office Appointment Initial Comment Needs to make same day appt, pls call back, office is not answering past opening and pt is wanting to know why office is not answering Patient request to speak to RN No Disp. Time Disposition Final User 02/04/2021 8:08:09 AM General Information Provided Yes Donato Heinz Call Closed By: Donato Heinz Transaction Date/Time: 02/04/2021 8:04:51 AM (ET)

## 2021-02-21 ENCOUNTER — Other Ambulatory Visit: Payer: Self-pay

## 2021-02-21 ENCOUNTER — Ambulatory Visit (INDEPENDENT_AMBULATORY_CARE_PROVIDER_SITE_OTHER): Payer: 59 | Admitting: Nurse Practitioner

## 2021-02-21 VITALS — BP 118/62 | HR 75 | Temp 98.0°F | Ht 66.5 in | Wt 153.4 lb

## 2021-02-21 DIAGNOSIS — R339 Retention of urine, unspecified: Secondary | ICD-10-CM

## 2021-02-21 DIAGNOSIS — N898 Other specified noninflammatory disorders of vagina: Secondary | ICD-10-CM | POA: Diagnosis not present

## 2021-02-21 DIAGNOSIS — R300 Dysuria: Secondary | ICD-10-CM | POA: Diagnosis not present

## 2021-02-21 DIAGNOSIS — Z7251 High risk heterosexual behavior: Secondary | ICD-10-CM

## 2021-02-21 DIAGNOSIS — R8290 Unspecified abnormal findings in urine: Secondary | ICD-10-CM

## 2021-02-21 LAB — POCT URINE PREGNANCY: Preg Test, Ur: NEGATIVE

## 2021-02-21 LAB — POCT URINALYSIS DIPSTICK
Bilirubin, UA: O NEG
Glucose, UA: NEGATIVE
Nitrite, UA: NEGATIVE
Protein, UA: POSITIVE — AB
Spec Grav, UA: 1.025 (ref 1.010–1.025)
Urobilinogen, UA: 1 E.U./dL
pH, UA: 6 (ref 5.0–8.0)

## 2021-02-21 NOTE — Patient Instructions (Signed)
Nice to see you today Will be in touch about the lab results We will hold off on antibiotics for now like discussed. If you start having signs or symptoms of nausea, vomiting, unbearable abdominal pain, or increased urinary symptoms let me know or be evaluated at urgent care

## 2021-02-21 NOTE — Assessment & Plan Note (Signed)
Patient did complain of vaginal itching and discharge.  States some abnormal vaginal odor.  Did send off GC chlamydia swabs along with wet prep pending results.

## 2021-02-21 NOTE — Assessment & Plan Note (Signed)
Patient states she has had new sexual partner but only 1 time it was unprotected.

## 2021-02-21 NOTE — Assessment & Plan Note (Addendum)
Of anyPatient having atypical symptoms of urinary tract infection.  Given the length of symptoms and having discussion with patient we will hold off antibiotic treatment currently.  Did offer to send medications into the pharmacy just in case patient declined.  We will wait lab results before prescribing any regimen.  Signs and symptoms reviewed with patient in regards to when to seek urgent and emergent health care.  Did review to previous UAs and cultures patient has had history of testing negative on UA and testing positive and urine culture did discuss that with patient

## 2021-02-21 NOTE — Progress Notes (Signed)
Acute Office Visit  Subjective:    Patient ID: Jennifer [name], female    DOB: 1995/11/20, 25 y.o.   MRN: [id number]  Chief Complaint  Patient presents with   Vaginal Itching    And burning with urination, some abnormal vaginal odor. Started right after last menstrual cycle around 01/30/21     Patient is in today for  Symptoms started after her emergency room visit. Started on 01/30/2021. States that the symptoms have waxed and waned. Patient has a history of kidney stones and recurrent UTIs. States she has had new sexual partners but only 1 that was unprotected. Low concern for STI. No concern for pregnancy.   Past Medical History:  Diagnosis Date   Irregular periods    Kidney stone on right side 12/24/2017   Strabismus     Past Surgical History:  Procedure Laterality Date   EXTRACORPOREAL SHOCK WAVE LITHOTRIPSY Right 01/03/2018   Procedure: EXTRACORPOREAL SHOCK WAVE LITHOTRIPSY (ESWL);  Surgeon: Abbie Sons, MD;  Location: [facility] ORS;  Service: Urology;  Laterality: Right;   EYE SURGERY     WISDOM TOOTH EXTRACTION      Family History  Problem Relation Age of Onset   Alcohol abuse Mother    Kidney failure Father     Social History   Socioeconomic History   Marital status: Single    Spouse name: Not on file   Number of children: Not on file   Years of education: Not on file   Highest education level: Not on file  Occupational History   Not on file  Tobacco Use   Smoking status: Former    Types: Cigarettes    Quit date: 03/20/2015    Years since quitting: 5.9   Smokeless tobacco: Never  Vaping Use   Vaping Use: Never used  Substance and Sexual Activity   Alcohol use: Yes    Alcohol/week: 0.0 standard drinks    Comment: occ   Drug use: No   Sexual activity: Yes  Other Topics Concern   Not on file  Social History Narrative   Attending [employer] in the fall.   Sexually active, uses condoms.   Lives with dad.   Mom is recovering alcoholic.   Has good  support system- both grandmothers live near by.     Social Determinants of Health   Financial Resource Strain: Not on file  Food Insecurity: Not on file  Transportation Needs: Not on file  Physical Activity: Not on file  Stress: Not on file  Social Connections: Not on file  Intimate Partner Violence: Not on file    Outpatient Medications Prior to Visit  Medication Sig Dispense Refill   Cranberry 125 MG TABS Take 2 tablets by mouth in the morning and at bedtime.     ibuprofen (ADVIL) 200 MG tablet Take 600 mg by mouth every 6 (six) hours as needed for moderate pain.      cephALEXin (KEFLEX) 500 MG capsule Take 1 capsule (500 mg total) by mouth 4 (four) times daily. 28 capsule 0   No facility-administered medications prior to visit.    Allergies  Allergen Reactions   Sulfa Antibiotics Hives    Review of Systems  Constitutional:  Negative for chills and fever.  Respiratory:  Negative for shortness of breath.   Cardiovascular:  Negative for chest pain.  Gastrointestinal:  Negative for abdominal pain, diarrhea, nausea and vomiting.  Genitourinary:  Positive for vaginal discharge. Negative for dysuria, frequency, hematuria, vaginal bleeding and vaginal  pain.       Incomplete emptying +, vaginal itching +      Objective:    Physical Exam Vitals and nursing note reviewed. Exam conducted with a chaperone present [facility], CMA).  Constitutional:      Appearance: Normal appearance.  Cardiovascular:     Rate and Rhythm: Normal rate and regular rhythm.  Pulmonary:     Effort: Pulmonary effort is normal.     Breath sounds: Normal breath sounds.  Abdominal:     General: Bowel sounds are normal. There is no distension.     Palpations: There is no mass.     Tenderness: There is no abdominal tenderness. There is no right CVA tenderness or left CVA tenderness.  Genitourinary:    Exam position: Lithotomy position.     Vagina: Vaginal discharge present. No tenderness or  bleeding.     Cervix: No cervical motion tenderness or discharge.     Uterus: Not tender.      Adnexa:        Right: No tenderness.         Left: No tenderness.       Comments: Patient had small amount of scant white discharge that pooled in vaginal vault Skin:    General: Skin is warm.  Neurological:     Mental Status: She is alert.  Psychiatric:        Mood and Affect: Mood normal.        Behavior: Behavior normal.        Thought Content: Thought content normal.        Judgment: Judgment normal.    BP 118/62   Pulse 75   Temp 98 F (36.7 C)   Ht 5' 6.5" (1.689 m)   Wt 153 lb 6 oz (69.6 kg)   LMP 01/30/2021   SpO2 95%   BMI 24.38 kg/m  Wt Readings from Last 3 Encounters:  02/21/21 153 lb 6 oz (69.6 kg)  02/04/21 156 lb (70.8 kg)  11/25/20 157 lb (71.2 kg)    Health Maintenance Due  Topic Date Due   COVID-19 Vaccine (1) Never done   Hepatitis C Screening  Never done   PAP-Cervical Cytology Screening  04/13/2018   PAP SMEAR-Modifier  04/13/2018   INFLUENZA VACCINE  Never done    There are no preventive care reminders to display for this patient.   Lab Results  Component Value Date   TSH 2.22 03/25/2018   Lab Results  Component Value Date   WBC 14.3 (H) 11/28/2018   HGB 14.0 11/28/2018   HCT 41.5 11/28/2018   MCV 91.8 11/28/2018   PLT 231 11/28/2018   Lab Results  Component Value Date   NA 139 11/28/2018   K 3.8 11/28/2018   CO2 25 11/28/2018   GLUCOSE 95 11/28/2018   BUN 16 11/28/2018   CREATININE 0.67 11/28/2018   BILITOT 0.4 12/31/2014   ALKPHOS 80 12/31/2014   AST 15 12/31/2014   ALT 13 12/31/2014   PROT 7.9 12/31/2014   ALBUMIN 4.7 12/31/2014   CALCIUM 9.4 11/28/2018   ANIONGAP 10 11/28/2018   GFR 106.08 03/25/2018   Lab Results  Component Value Date   CHOL 153 12/31/2014   Lab Results  Component Value Date   HDL 46.00 12/31/2014   Lab Results  Component Value Date   LDLCALC 92 12/31/2014   Lab Results  Component Value Date    TRIG 72.0 12/31/2014   Lab Results  Component Value Date  CHOLHDL 3 12/31/2014   No results found for: HGBA1C     Assessment & Plan:   Problem List Items Addressed This Visit       Musculoskeletal and Integument   Vaginal itching - Primary    Patient did complain of vaginal itching and discharge.  States some abnormal vaginal odor.  Did send off GC chlamydia swabs along with wet prep pending results.      Relevant Orders   WET PREP BY MOLECULAR PROBE   Trichomonas vaginalis, RNA   C. TRACHOMATIS/N. GONORRHOEAE RNA ([facility])     Other   High risk heterosexual behavior    Patient states she has had new sexual partner but only 1 time it was unprotected.      Relevant Orders   POCT urine pregnancy (Completed)   Incomplete emptying of bladder    Of anyPatient having atypical symptoms of urinary tract infection.  Given the length of symptoms and having discussion with patient we will hold off antibiotic treatment currently.  Did offer to send medications into the pharmacy just in case patient declined.  We will wait lab results before prescribing any regimen.  Signs and symptoms reviewed with patient in regards to when to seek urgent and emergent health care.      Relevant Orders   POCT urinalysis dipstick (Completed)   C. TRACHOMATIS/N. GONORRHOEAE RNA ([facility])   Other Visit Diagnoses     Abnormal urine       Relevant Orders   Urine Culture        No orders of the defined types were placed in this encounter.  This visit occurred during the SARS-CoV-2 public health emergency.  Safety protocols were in place, including screening questions prior to the visit, additional usage of staff PPE, and extensive cleaning of exam room while observing appropriate contact time as indicated for disinfecting solutions.   Romilda Garret, NP

## 2021-02-22 LAB — WET PREP BY MOLECULAR PROBE
Candida species: NOT DETECTED
MICRO NUMBER:: 12391684
SPECIMEN QUALITY:: ADEQUATE
Trichomonas vaginosis: NOT DETECTED

## 2021-02-22 LAB — URINE CULTURE
MICRO NUMBER:: 12391685
SPECIMEN QUALITY:: ADEQUATE

## 2021-02-22 LAB — C. TRACHOMATIS/N. GONORRHOEAE RNA
C. trachomatis RNA, TMA: NOT DETECTED
N. gonorrhoeae RNA, TMA: NOT DETECTED

## 2021-02-22 LAB — TRICHOMONAS VAGINALIS, PROBE AMP: Trichomonas vaginalis RNA: NOT DETECTED

## 2021-02-23 ENCOUNTER — Other Ambulatory Visit: Payer: Self-pay | Admitting: Nurse Practitioner

## 2021-02-23 DIAGNOSIS — N760 Acute vaginitis: Secondary | ICD-10-CM

## 2021-02-23 DIAGNOSIS — B9689 Other specified bacterial agents as the cause of diseases classified elsewhere: Secondary | ICD-10-CM

## 2021-02-23 MED ORDER — METRONIDAZOLE 500 MG PO TABS: 500.0000 mg | ORAL_TABLET | Freq: Two times a day (BID) | ORAL | 0 refills | Status: AC

## 2021-02-23 NOTE — Progress Notes (Signed)
Orders only for BV

## 2021-03-02 DIAGNOSIS — N871 Moderate cervical dysplasia: Secondary | ICD-10-CM | POA: Diagnosis not present

## 2021-06-13 ENCOUNTER — Ambulatory Visit (INDEPENDENT_AMBULATORY_CARE_PROVIDER_SITE_OTHER): Payer: 59 | Admitting: Nurse Practitioner

## 2021-06-13 ENCOUNTER — Other Ambulatory Visit: Payer: Self-pay

## 2021-06-13 VITALS — BP 120/70 | HR 68 | Temp 97.9°F | Resp 12 | Ht 66.5 in | Wt 155.5 lb

## 2021-06-13 DIAGNOSIS — R8290 Unspecified abnormal findings in urine: Secondary | ICD-10-CM | POA: Diagnosis not present

## 2021-06-13 DIAGNOSIS — Z7251 High risk heterosexual behavior: Secondary | ICD-10-CM

## 2021-06-13 DIAGNOSIS — N898 Other specified noninflammatory disorders of vagina: Secondary | ICD-10-CM

## 2021-06-13 LAB — POCT URINALYSIS DIPSTICK
Bilirubin, UA: NEGATIVE
Blood, UA: NEGATIVE
Glucose, UA: NEGATIVE
Ketones, UA: NEGATIVE
Nitrite, UA: NEGATIVE
Protein, UA: POSITIVE — AB
Spec Grav, UA: 1.02 (ref 1.010–1.025)
Urobilinogen, UA: 0.2 E.U./dL
pH, UA: 7 (ref 5.0–8.0)

## 2021-06-13 LAB — POCT URINE PREGNANCY: Preg Test, Ur: NEGATIVE

## 2021-06-13 NOTE — Assessment & Plan Note (Signed)
Abnormal UA.  In the setting of having some urinary symptoms beginning that resolved we will send off for culture.  Pending results

## 2021-06-13 NOTE — Assessment & Plan Note (Signed)
Patient here for some vaginal itching, discharge, irritation.  Pending wet prep along with GC chlamydia swabs.  Patient was seen via E-visit and prescribed Diflucan states some of her symptoms have resolved and for like to continue to resolve but she wanted to make certain.  Pending test results

## 2021-06-13 NOTE — Patient Instructions (Signed)
Nice to see you today Abstain from sex until we get the results of the swabs back I will be in touch once they result Follow up if symptoms persists or fail to improve

## 2021-06-13 NOTE — Assessment & Plan Note (Signed)
Vaginal discharge.  Does have a yeast infection.  Has been treated for yeast infections Discharge pending test results abstain from sexual activity until results are back

## 2021-06-13 NOTE — Progress Notes (Signed)
Acute Office Visit  Subjective:    Patient ID: Jennifer [name], female    DOB: 08/17/1995, 26 y.o.   MRN: [id number]  Chief Complaint  Patient presents with   Vaginal Itching    X 1 week, started with itching, irritation/soreness feeling, some discharge, also had some uncomfortable with urination episodes. Took Flagyl 150 a few days ago after doing like an e visit.     Patient is in today for Vaginal itching/discharge  Symptoms started approx 1 week ago with itching and irration  and did an  e visit and was given a 150 diflucan. Some of her symptoms improved.   Discharge described as light yellow without odor. Most time she uses protection when she has sex but had one episode of unprotected sex approx 1 week ago. States when symptoms started she had uncomfortable urination that has resolved since. Would like to be checked for STIs also.  Hx of BV Past Medical History:  Diagnosis Date   Irregular periods    Kidney stone on right side 12/24/2017   Strabismus     Past Surgical History:  Procedure Laterality Date   EXTRACORPOREAL SHOCK WAVE LITHOTRIPSY Right 01/03/2018   Procedure: EXTRACORPOREAL SHOCK WAVE LITHOTRIPSY (ESWL);  Surgeon: Abbie Sons, MD;  Location: [facility] ORS;  Service: Urology;  Laterality: Right;   EYE SURGERY     WISDOM TOOTH EXTRACTION      Family History  Problem Relation Age of Onset   Alcohol abuse Mother    Kidney failure Father     Social History   Socioeconomic History   Marital status: Single    Spouse name: Not on file   Number of children: Not on file   Years of education: Not on file   Highest education level: Not on file  Occupational History   Not on file  Tobacco Use   Smoking status: Former    Types: Cigarettes    Quit date: 03/20/2015    Years since quitting: 6.2   Smokeless tobacco: Never  Vaping Use   Vaping Use: Never used  Substance and Sexual Activity   Alcohol use: Yes    Alcohol/week: 0.0 standard drinks     Comment: occ   Drug use: No   Sexual activity: Yes  Other Topics Concern   Not on file  Social History Narrative   Attending [employer] in the fall.   Sexually active, uses condoms.   Lives with dad.   Mom is recovering alcoholic.   Has good support system- both grandmothers live near by.     Social Determinants of Health   Financial Resource Strain: Not on file  Food Insecurity: Not on file  Transportation Needs: Not on file  Physical Activity: Not on file  Stress: Not on file  Social Connections: Not on file  Intimate Partner Violence: Not on file    Outpatient Medications Prior to Visit  Medication Sig Dispense Refill   Cranberry 125 MG TABS Take 2 tablets by mouth in the morning and at bedtime.     ibuprofen (ADVIL) 200 MG tablet Take 600 mg by mouth every 6 (six) hours as needed for moderate pain.      Probiotic Product (PROBIOTIC-10 PO) Take by mouth.     No facility-administered medications prior to visit.    Allergies  Allergen Reactions   Sulfa Antibiotics Hives    Review of Systems  Constitutional:  Negative for chills and fever.  Gastrointestinal:  Negative for abdominal pain, diarrhea,  nausea and vomiting.  Genitourinary:  Positive for vaginal discharge. Negative for dysuria, frequency, hematuria and vaginal bleeding.       Itching vaginally Negative for vaginal odor      Objective:    Physical Exam Vitals and nursing note reviewed. Exam conducted with a chaperone present [employer], RMA).  Constitutional:      Appearance: Normal appearance.  Cardiovascular:     Rate and Rhythm: Normal rate and regular rhythm.  Pulmonary:     Effort: Pulmonary effort is normal.     Breath sounds: Normal breath sounds.  Abdominal:     General: Bowel sounds are normal. There is no distension.     Palpations: There is no mass.     Tenderness: There is no abdominal tenderness.  Genitourinary:    Exam position: Lithotomy position.     Vagina: Vaginal discharge  present.     Cervix: Discharge present. No cervical motion tenderness, friability or cervical bleeding.     Uterus: Normal.      Adnexa: Right adnexa normal and left adnexa normal.     Comments: White discharge. Modest amount. Thicker in nature Skin:    General: Skin is warm.  Neurological:     Mental Status: She is alert.    There were no vitals taken for this visit. Wt Readings from Last 3 Encounters:  02/21/21 153 lb 6 oz (69.6 kg)  02/04/21 156 lb (70.8 kg)  11/25/20 157 lb (71.2 kg)    Health Maintenance Due  Topic Date Due   COVID-19 Vaccine (1) Never done   Hepatitis C Screening  Never done   PAP-Cervical Cytology Screening  04/13/2018   PAP SMEAR-Modifier  04/13/2018    There are no preventive care reminders to display for this patient.   Lab Results  Component Value Date   TSH 2.22 03/25/2018   Lab Results  Component Value Date   WBC 14.3 (H) 11/28/2018   HGB 14.0 11/28/2018   HCT 41.5 11/28/2018   MCV 91.8 11/28/2018   PLT 231 11/28/2018   Lab Results  Component Value Date   NA 139 11/28/2018   K 3.8 11/28/2018   CO2 25 11/28/2018   GLUCOSE 95 11/28/2018   BUN 16 11/28/2018   CREATININE 0.67 11/28/2018   BILITOT 0.4 12/31/2014   ALKPHOS 80 12/31/2014   AST 15 12/31/2014   ALT 13 12/31/2014   PROT 7.9 12/31/2014   ALBUMIN 4.7 12/31/2014   CALCIUM 9.4 11/28/2018   ANIONGAP 10 11/28/2018   GFR 106.08 03/25/2018   Lab Results  Component Value Date   CHOL 153 12/31/2014   Lab Results  Component Value Date   HDL 46.00 12/31/2014   Lab Results  Component Value Date   LDLCALC 92 12/31/2014   Lab Results  Component Value Date   TRIG 72.0 12/31/2014   Lab Results  Component Value Date   CHOLHDL 3 12/31/2014   No results found for: HGBA1C     Assessment & Plan:   Problem List Items Addressed This Visit       Musculoskeletal and Integument   Vaginal itching - Primary    Patient here for some vaginal itching, discharge, irritation.   Pending wet prep along with GC chlamydia swabs.  Patient was seen via E-visit and prescribed Diflucan states some of her symptoms have resolved and for like to continue to resolve but she wanted to make certain.  Pending test results      Relevant Orders  POCT urinalysis dipstick (Completed)   WET PREP BY MOLECULAR PROBE     Other   High risk heterosexual behavior   Relevant Orders   POCT urine pregnancy (Completed)   C. trachomatis/N. gonorrhoeae RNA   HIV Antibody (routine testing w rflx)   RPR   Vaginal discharge    Vaginal discharge.  Does have a yeast infection.  Has been treated for yeast infections Discharge pending test results abstain from sexual activity until results are back      Relevant Orders   WET PREP BY MOLECULAR PROBE   Abnormal urine    Abnormal UA.  In the setting of having some urinary symptoms beginning that resolved we will send off for culture.  Pending results      Relevant Orders   Urine Culture     No orders of the defined types were placed in this encounter.  This visit occurred during the SARS-CoV-2 public health emergency.  Safety protocols were in place, including screening questions prior to the visit, additional usage of staff PPE, and extensive cleaning of exam room while observing appropriate contact time as indicated for disinfecting solutions.    Romilda Garret, NP

## 2021-06-14 ENCOUNTER — Other Ambulatory Visit: Payer: Self-pay | Admitting: Nurse Practitioner

## 2021-06-14 DIAGNOSIS — B9689 Other specified bacterial agents as the cause of diseases classified elsewhere: Secondary | ICD-10-CM

## 2021-06-14 LAB — C. TRACHOMATIS/N. GONORRHOEAE RNA
C. trachomatis RNA, TMA: NOT DETECTED
N. gonorrhoeae RNA, TMA: NOT DETECTED

## 2021-06-14 LAB — WET PREP BY MOLECULAR PROBE
Candida species: NOT DETECTED
MICRO NUMBER:: 12845067
SPECIMEN QUALITY:: ADEQUATE
Trichomonas vaginosis: NOT DETECTED

## 2021-06-14 LAB — URINE CULTURE
MICRO NUMBER:: 12845056
SPECIMEN QUALITY:: ADEQUATE

## 2021-06-14 LAB — HIV ANTIBODY (ROUTINE TESTING W REFLEX): HIV 1&2 Ab, 4th Generation: NONREACTIVE

## 2021-06-14 LAB — RPR: RPR Ser Ql: NONREACTIVE

## 2021-06-14 MED ORDER — METRONIDAZOLE 500 MG PO TABS: 500.0000 mg | ORAL_TABLET | Freq: Two times a day (BID) | ORAL | 0 refills | Status: AC

## 2021-07-05 DIAGNOSIS — H5213 Myopia, bilateral: Secondary | ICD-10-CM | POA: Diagnosis not present

## 2021-07-05 DIAGNOSIS — H0288A Meibomian gland dysfunction right eye, upper and lower eyelids: Secondary | ICD-10-CM | POA: Diagnosis not present

## 2021-07-05 DIAGNOSIS — H04123 Dry eye syndrome of bilateral lacrimal glands: Secondary | ICD-10-CM | POA: Diagnosis not present

## 2021-07-05 DIAGNOSIS — H0288B Meibomian gland dysfunction left eye, upper and lower eyelids: Secondary | ICD-10-CM | POA: Diagnosis not present

## 2021-07-05 DIAGNOSIS — H1045 Other chronic allergic conjunctivitis: Secondary | ICD-10-CM | POA: Diagnosis not present

## 2021-07-05 DIAGNOSIS — H52223 Regular astigmatism, bilateral: Secondary | ICD-10-CM | POA: Diagnosis not present

## 2021-08-09 DIAGNOSIS — Z124 Encounter for screening for malignant neoplasm of cervix: Secondary | ICD-10-CM | POA: Diagnosis not present

## 2021-08-09 DIAGNOSIS — R87612 Low grade squamous intraepithelial lesion on cytologic smear of cervix (LGSIL): Secondary | ICD-10-CM | POA: Diagnosis not present

## 2021-08-09 DIAGNOSIS — Z6824 Body mass index (BMI) 24.0-24.9, adult: Secondary | ICD-10-CM | POA: Diagnosis not present

## 2021-08-09 DIAGNOSIS — Z3202 Encounter for pregnancy test, result negative: Secondary | ICD-10-CM | POA: Diagnosis not present

## 2021-08-09 DIAGNOSIS — Z01419 Encounter for gynecological examination (general) (routine) without abnormal findings: Secondary | ICD-10-CM | POA: Diagnosis not present

## 2021-08-09 DIAGNOSIS — Z113 Encounter for screening for infections with a predominantly sexual mode of transmission: Secondary | ICD-10-CM | POA: Diagnosis not present

## 2021-08-09 DIAGNOSIS — R87610 Atypical squamous cells of undetermined significance on cytologic smear of cervix (ASC-US): Secondary | ICD-10-CM | POA: Diagnosis not present

## 2021-08-09 DIAGNOSIS — N871 Moderate cervical dysplasia: Secondary | ICD-10-CM | POA: Diagnosis not present

## 2021-08-10 DIAGNOSIS — A609 Anogenital herpesviral infection, unspecified: Secondary | ICD-10-CM | POA: Diagnosis not present

## 2021-08-15 ENCOUNTER — Encounter: Payer: Self-pay | Admitting: Nurse Practitioner

## 2021-08-15 ENCOUNTER — Other Ambulatory Visit: Payer: Self-pay

## 2021-08-15 ENCOUNTER — Ambulatory Visit (INDEPENDENT_AMBULATORY_CARE_PROVIDER_SITE_OTHER): Payer: 59 | Admitting: Nurse Practitioner

## 2021-08-15 VITALS — BP 94/62 | HR 71 | Temp 96.8°F | Resp 10 | Ht 66.5 in | Wt 159.2 lb

## 2021-08-15 DIAGNOSIS — R051 Acute cough: Secondary | ICD-10-CM | POA: Diagnosis not present

## 2021-08-15 DIAGNOSIS — R0982 Postnasal drip: Secondary | ICD-10-CM | POA: Insufficient documentation

## 2021-08-15 DIAGNOSIS — J029 Acute pharyngitis, unspecified: Secondary | ICD-10-CM | POA: Diagnosis not present

## 2021-08-15 DIAGNOSIS — J069 Acute upper respiratory infection, unspecified: Secondary | ICD-10-CM | POA: Diagnosis not present

## 2021-08-15 LAB — POCT INFLUENZA A/B
Influenza A, POC: NEGATIVE
Influenza B, POC: NEGATIVE

## 2021-08-15 LAB — POCT RAPID STREP A (OFFICE): Rapid Strep A Screen: NEGATIVE

## 2021-08-15 MED ORDER — AZITHROMYCIN 250 MG PO TABS: ORAL_TABLET | ORAL | 0 refills | Status: AC

## 2021-08-15 MED ORDER — FLUTICASONE PROPIONATE 50 MCG/ACT NA SUSP: 2.0000 | Freq: Every day | NASAL | 0 refills | Status: DC

## 2021-08-15 NOTE — Progress Notes (Signed)
? ?Acute Office Visit ? ?Subjective:  ? ? Patient ID: Jennifer [name], female    DOB: Jun 10, 1995, 26 y.o.   MRN: [id number] ? ?Chief Complaint  ?Patient presents with  ? Sore Throat  ?  X 1 week, cough and chest congestion., post nasal drip. No headache or fever. Covid test negative at home around 08/12/21  ? ? ? ?Patient is in today for sore throat ? ?Last Saturday (08/06/2021) her sympotms started ?No sick contacts ?Covid test at home approx 3 days ago and was negative ?Has been using dayquill and pseudofed without relief  ?No covid or flu vaccine ?Past Medical History:  ?Diagnosis Date  ? Irregular periods   ? Kidney stone on right side 12/24/2017  ? Strabismus   ? ? ?Past Surgical History:  ?Procedure Laterality Date  ? EXTRACORPOREAL SHOCK WAVE LITHOTRIPSY Right 01/03/2018  ? Procedure: EXTRACORPOREAL SHOCK WAVE LITHOTRIPSY (ESWL);  Surgeon: Abbie Sons, MD;  Location: [facility] ORS;  Service: Urology;  Laterality: Right;  ? EYE SURGERY    ? WISDOM TOOTH EXTRACTION    ? ? ?Family History  ?Problem Relation Age of Onset  ? Alcohol abuse Mother   ? Kidney failure Father   ? ? ?Social History  ? ?Socioeconomic History  ? Marital status: Single  ?  Spouse name: Not on file  ? Number of children: Not on file  ? Years of education: Not on file  ? Highest education level: Not on file  ?Occupational History  ? Not on file  ?Tobacco Use  ? Smoking status: Former  ?  Types: Cigarettes  ?  Quit date: 03/20/2015  ?  Years since quitting: 6.4  ? Smokeless tobacco: Never  ?Vaping Use  ? Vaping Use: Never used  ?Substance and Sexual Activity  ? Alcohol use: Yes  ?  Alcohol/week: 0.0 standard drinks  ?  Comment: occ  ? Drug use: No  ? Sexual activity: Yes  ?Other Topics Concern  ? Not on file  ?Social History Narrative  ? Attending [employer] in the fall.  ? Sexually active, uses condoms.  ? Lives with dad.  ? Mom is recovering alcoholic.  ? Has good support system- both grandmothers live near by.    ? ?Social Determinants of  Health  ? ?Financial Resource Strain: Not on file  ?Food Insecurity: Not on file  ?Transportation Needs: Not on file  ?Physical Activity: Not on file  ?Stress: Not on file  ?Social Connections: Not on file  ?Intimate Partner Violence: Not on file  ? ? ?Outpatient Medications Prior to Visit  ?Medication Sig Dispense Refill  ? Cranberry 125 MG TABS Take 2 tablets by mouth in the morning and at bedtime.    ? ibuprofen (ADVIL) 200 MG tablet Take 600 mg by mouth every 6 (six) hours as needed for moderate pain.     ? Probiotic Product (PROBIOTIC-10 PO) Take by mouth.    ? ?No facility-administered medications prior to visit.  ? ? ?Allergies  ?Allergen Reactions  ? Sulfa Antibiotics Hives  ? ? ?Review of Systems  ?Constitutional:  Positive for fatigue. Negative for appetite change, chills and fever.  ?HENT:  Positive for congestion, postnasal drip and sore throat. Negative for ear discharge, ear pain, sinus pressure and sinus pain.   ?Respiratory:  Positive for cough (dry cough. intermitten productivity). Negative for shortness of breath.   ?Cardiovascular:  Negative for chest pain.  ?Gastrointestinal:  Negative for diarrhea, nausea and vomiting.  ?Musculoskeletal:  Negative  for arthralgias and myalgias.  ?Neurological:  Positive for weakness. Negative for dizziness, light-headedness and headaches.  ? ?   ?Objective:  ?  ?Physical Exam ?Vitals and nursing note reviewed.  ?Constitutional:   ?   Appearance: She is well-developed.  ?HENT:  ?   Right Ear: Tympanic membrane and ear canal normal.  ?   Left Ear: Tympanic membrane and ear canal normal.  ?   Nose:  ?   Right Sinus: No maxillary sinus tenderness or frontal sinus tenderness.  ?   Left Sinus: No maxillary sinus tenderness or frontal sinus tenderness.  ?   Mouth/Throat:  ?   Mouth: Mucous membranes are moist.  ?   Pharynx: Oropharynx is clear.  ?   Comments: Cobblest[place] ? ?Cardiovascular:  ?   Rate and Rhythm: Normal rate and regular rhythm.  ?   Heart sounds: Normal  heart sounds.  ?Pulmonary:  ?   Effort: Pulmonary effort is normal.  ?   Breath sounds: Normal breath sounds.  ?Abdominal:  ?   General: Bowel sounds are normal.  ?Skin: ?   General: Skin is warm.  ?Neurological:  ?   Mental Status: She is alert.  ? ? ?BP 94/62   Pulse 71   Temp (!) 96.8 ?F (36 ?C)   Resp 10   Ht 5' 6.5" (1.689 m)   Wt 159 lb 4 oz (72.2 kg)   LMP 07/21/2021   SpO2 94%   BMI 25.32 kg/m?  ?Wt Readings from Last 3 Encounters:  ?08/15/21 159 lb 4 oz (72.2 kg)  ?06/13/21 155 lb 8 oz (70.5 kg)  ?02/21/21 153 lb 6 oz (69.6 kg)  ? ? ?Health Maintenance Due  ?Topic Date Due  ? Hepatitis C Screening  Never done  ? PAP-Cervical Cytology Screening  04/13/2018  ? PAP SMEAR-Modifier  04/13/2018  ? ? ?There are no preventive care reminders to display for this patient. ? ? ?Lab Results  ?Component Value Date  ? TSH 2.22 03/25/2018  ? ?Lab Results  ?Component Value Date  ? WBC 14.3 (H) 11/28/2018  ? HGB 14.0 11/28/2018  ? HCT 41.5 11/28/2018  ? MCV 91.8 11/28/2018  ? PLT 231 11/28/2018  ? ?Lab Results  ?Component Value Date  ? NA 139 11/28/2018  ? K 3.8 11/28/2018  ? CO2 25 11/28/2018  ? GLUCOSE 95 11/28/2018  ? BUN 16 11/28/2018  ? CREATININE 0.67 11/28/2018  ? BILITOT 0.4 12/31/2014  ? ALKPHOS 80 12/31/2014  ? AST 15 12/31/2014  ? ALT 13 12/31/2014  ? PROT 7.9 12/31/2014  ? ALBUMIN 4.7 12/31/2014  ? CALCIUM 9.4 11/28/2018  ? ANIONGAP 10 11/28/2018  ? GFR 106.08 03/25/2018  ? ?Lab Results  ?Component Value Date  ? CHOL 153 12/31/2014  ? ?Lab Results  ?Component Value Date  ? HDL 46.00 12/31/2014  ? ?Lab Results  ?Component Value Date  ? [place] 92 12/31/2014  ? ?Lab Results  ?Component Value Date  ? TRIG 72.0 12/31/2014  ? ?Lab Results  ?Component Value Date  ? CHOLHDL 3 12/31/2014  ? ?No results found for: HGBA1C ? ?   ?Assessment & Plan:  ? ?Problem List Items Addressed This Visit   ? ?  ? Respiratory  ? Upper respiratory tract infection  ?  Given length of symptoms and patient presentation will elect to  treat with azithromycin 250 mg per package instruction.  Patient EKG over-the-counter medications if beneficial.  Patient declined work/school note today in office.  Follow-up  if no improvement ?  ?  ? Relevant Medications  ? fluticasone (FLONASE) 50 MCG/ACT nasal spray  ? azithromycin (ZITHROMAX) 250 MG tablet  ?  ? Other  ? Sore throat - Primary  ?  Patient complaining of sore throat for approximately 10 days.  Has been using over-the-counter analgesics and cold medications without great relief.  Strep test and flu test in office negative.  Last COVID test negative at home.  We will treat her empirically with azithromycin 250 mg/pack instructions.  Follow-up if no improvement ?  ?  ? Relevant Orders  ? Rapid Strep A  ? POCT Influenza A/B  ? PND (post-nasal drip)  ?  Could be contributing to sore throat this not resolving.  We will treat patient with some fluticasone nasal spray.  Fluticasone nasal spray 2 sprays each nostril once daily.  Did discuss with patient the possibility of an epistaxis follow-up if no improvement or symptoms worsen ?  ?  ? Relevant Medications  ? fluticasone (FLONASE) 50 MCG/ACT nasal spray  ? ?Other Visit Diagnoses   ? ? Acute cough      ? Relevant Orders  ? Rapid Strep A  ? POCT Influenza A/B  ? ?  ? ? ? ?Meds ordered this encounter  ?Medications  ? fluticasone (FLONASE) 50 MCG/ACT nasal spray  ?  Sig: Place 2 sprays into both nostrils daily.  ?  Dispense:  16 g  ?  Refill:  0  ?  Order Specific Question:   Supervising Provider  ?  Answer:   Loura Pardon A [[id number]]  ? azithromycin (ZITHROMAX) 250 MG tablet  ?  Sig: Take 2 tablets on day 1, then 1 tablet daily on days 2 through 5  ?  Dispense:  6 tablet  ?  Refill:  0  ?  Order Specific Question:   Supervising Provider  ?  Answer:   Loura Pardon A [[id number]]  ? ?This visit occurred during the SARS-CoV-2 public health emergency.  Safety protocols were in place, including screening questions prior to the visit, additional usage of staff PPE, and  extensive cleaning of exam room while observing appropriate contact time as indicated for disinfecting solutions.  ? ? ?Romilda Garret, NP ? ?

## 2021-08-15 NOTE — Assessment & Plan Note (Signed)
Patient complaining of sore throat for approximately 10 days.  Has been using over-the-counter analgesics and cold medications without great relief.  Strep test and flu test in office negative.  Last COVID test negative at home.  We will treat her empirically with azithromycin 250 mg/pack instructions.  Follow-up if no improvement ?

## 2021-08-15 NOTE — Patient Instructions (Signed)
Nice to see you today ?I sent in an antibiotic and a nose spray ?The nose spray can cause nosebleeds, if that happens either stop the nose spray or get some over the counter nasal saline spray ?Follow up if no improvement or symptoms worsen ? ?

## 2021-08-15 NOTE — Assessment & Plan Note (Signed)
Given length of symptoms and patient presentation will elect to treat with azithromycin 250 mg per package instruction.  Patient EKG over-the-counter medications if beneficial.  Patient declined work/school note today in office.  Follow-up if no improvement ?

## 2021-08-15 NOTE — Assessment & Plan Note (Signed)
Could be contributing to sore throat this not resolving.  We will treat patient with some fluticasone nasal spray.  Fluticasone nasal spray 2 sprays each nostril once daily.  Did discuss with patient the possibility of an epistaxis follow-up if no improvement or symptoms worsen ?

## 2021-10-03 ENCOUNTER — Other Ambulatory Visit: Payer: Self-pay | Admitting: Nurse Practitioner

## 2021-10-03 DIAGNOSIS — N6320 Unspecified lump in the left breast, unspecified quadrant: Secondary | ICD-10-CM

## 2021-10-04 ENCOUNTER — Ambulatory Visit
Admission: RE | Admit: 2021-10-04 | Discharge: 2021-10-04 | Disposition: A | Discharge status: Home / Self Care | Payer: 59 | Source: Ambulatory Visit | Attending: Nurse Practitioner | Admitting: Nurse Practitioner

## 2021-10-04 DIAGNOSIS — N6320 Unspecified lump in the left breast, unspecified quadrant: Secondary | ICD-10-CM

## 2021-10-07 ENCOUNTER — Other Ambulatory Visit: Payer: 59

## 2022-01-02 ENCOUNTER — Ambulatory Visit (INDEPENDENT_AMBULATORY_CARE_PROVIDER_SITE_OTHER): Payer: 59 | Admitting: Physician Assistant

## 2022-01-02 ENCOUNTER — Encounter: Payer: Self-pay | Admitting: Physician Assistant

## 2022-01-02 VITALS — BP 118/74 | HR 82 | Temp 98.3°F | Ht 66.5 in | Wt 153.4 lb

## 2022-01-02 DIAGNOSIS — R051 Acute cough: Secondary | ICD-10-CM

## 2022-01-02 MED ORDER — BENZONATATE 100 MG PO CAPS: 100.0000 mg | ORAL_CAPSULE | Freq: Three times a day (TID) | ORAL | 0 refills | Status: DC | PRN

## 2022-01-02 NOTE — Patient Instructions (Signed)
Good to meet you today!  Try Sudafed + Mucinex OTC for 3-5 days. Fluids, nasal saline, rest. Tessalon perles to help with cough suppression during the day.  I do not think antibiotics are needed at this time.  Let front desk know about request to transfer care.

## 2022-01-02 NOTE — Progress Notes (Signed)
Subjective:    Patient ID: Jennifer [name], female    DOB: 11/27/1995, 26 y.o.   MRN: [id number]  Chief Complaint  Patient presents with   Cough    Pt c/o cough for over a week and not getting better; no other symptoms; pt doesn't typically have seasonal allergies; Pt has tried Librarian, academic but no relief, tries not take medication unless absolutely necessary; Pt is interested in [facility] from [place] office to seeing Giannamarie Paulus going forward, wants to schedule CPE and labs;     Cough   Patient is in today for cough x 1 week. Does not feel sick though.  Started with congestion / mucous, but never felt like she was coming down with anything. No pressure in her face. Feels mucous in her throat that she could cough up. No issues at night. More she talks, the more she's coughing. Trying holistic route, not much help right now.   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 Just came back from the [place] July 9th. Otherwise not sure of sick contacts.   States she was living a moldy apartment for about 3.5 years, ceiling had come apart in places due to water damage. Denies any breathing / coughing issues while living there. No pets.    Past Medical History:  Diagnosis Date   Irregular periods    Kidney stone on right side 12/24/2017   Strabismus     Past Surgical History:  Procedure Laterality Date   EXTRACORPOREAL SHOCK WAVE LITHOTRIPSY Right 01/03/2018   Procedure: EXTRACORPOREAL SHOCK WAVE LITHOTRIPSY (ESWL);  Surgeon: Abbie Sons, MD;  Location: [facility] OR[facility];  Service: Urology;  Laterality: Right;   EYE SURGERY     WISDOM TOOTH EXTRACTION      Family History  Problem Relation Age of Onset   Alcohol abuse Mother    Kidney failure Father     Social History   Tobacco Use   Smoking status: Former    Types: Cigarettes    Quit date: 03/20/2015    Years since quitting: 6.8   Smokeless tobacco: Never  Vaping Use   Vaping Use: Never used  Substance Use Topics   Alcohol  use: Yes    Alcohol/week: 0.0 standard drinks of alcohol    Comment: occ   Drug use: No     Allergies  Allergen Reactions   Sulfa Antibiotics Hives    Review of Systems  Respiratory:  Positive for cough.    NEGATIVE UNLESS OTHERWISE INDICATED IN HPI      Objective:     BP 118/74 (BP Location: Right Arm)   Pulse 82   Temp 98.3 F (36.8 C) (Temporal)   Ht 5' 6.5" (1.689 m)   Wt 153 lb 6.4 oz (69.6 kg)   LMP 12/31/2021 (Exact Date)   SpO2 100%   BMI 24.39 kg/m   Wt Readings from Last 3 Encounters:  01/02/22 153 lb 6.4 oz (69.6 kg)  08/15/21 159 lb 4 oz (72.2 kg)  06/13/21 155 lb 8 oz (70.5 kg)    BP Readings from Last 3 Encounters:  01/02/22 118/74  08/15/21 94/62  06/13/21 120/70     Physical Exam Vitals and nursing note reviewed.  Constitutional:      General: She is not in acute distress.    Appearance: Normal appearance. She is not ill-appearing.  HENT:     Head: Normocephalic.     Right Ear: Tympanic membrane, ear canal and external ear normal.  Left Ear: Tympanic membrane, ear canal and external ear normal.     Nose: No congestion.     Mouth/Throat:     Mouth: Mucous membranes are moist.     Pharynx: No oropharyngeal exudate or posterior oropharyngeal erythema.  Eyes:     Extraocular Movements: Extraocular movements intact.     Conjunctiva/sclera: Conjunctivae normal.     Pupils: Pupils are equal, round, and reactive to light.  Cardiovascular:     Rate and Rhythm: Normal rate and regular rhythm.     Pulses: Normal pulses.     Heart sounds: Normal heart sounds. No murmur heard. Pulmonary:     Effort: Pulmonary effort is normal. No respiratory distress.     Breath sounds: Normal breath sounds. No wheezing or rhonchi.     Comments: Patient did not cough while in exam room Musculoskeletal:     Cervical back: Normal range of motion.  Skin:    General: Skin is warm.  Neurological:     Mental Status: She is alert and oriented to person, place,  and time.  Psychiatric:        Mood and Affect: Mood normal.        Behavior: Behavior normal.        Assessment & Plan:   Problem List Items Addressed This Visit   None Visit Diagnoses     Acute cough    -  Primary        Meds ordered this encounter  Medications   benzonatate (TESSALON PERLES) 100 MG capsule    Sig: Take 1 capsule (100 mg total) by mouth 3 (three) times daily as needed for cough.    Dispense:  30 capsule    Refill:  0    Order Specific Question:   Supervising Provider    Answer:   Garret Reddish O [[id number]]     1. Acute cough Reassured patient this cough is likely viral versus allergen in etiology.  No evidence of bacterial infection.  Plan to treat conservatively at this time with [facility]. Try Sudafed + Mucinex OTC for 3-5 days. Fluids, nasal saline, rest.  Please recheck back if worse or no improvement   Anah Billard M Rayven Hendrickson, PA-C

## 2022-01-03 ENCOUNTER — Telehealth: Payer: Self-pay | Admitting: Primary Care

## 2022-01-03 NOTE — Telephone Encounter (Signed)
Pt would like to transfer to [place] from [place] due to being closer to the [facility] office. Please advise

## 2022-01-03 NOTE — Telephone Encounter (Signed)
Fine with me, thanks. 

## 2022-01-12 ENCOUNTER — Ambulatory Visit (INDEPENDENT_AMBULATORY_CARE_PROVIDER_SITE_OTHER): Payer: 59 | Admitting: Family

## 2022-01-12 ENCOUNTER — Encounter: Payer: Self-pay | Admitting: Family

## 2022-01-12 ENCOUNTER — Other Ambulatory Visit (HOSPITAL_COMMUNITY)
Admission: RE | Admit: 2022-01-12 | Discharge: 2022-01-12 | Disposition: A | Discharge status: Home / Self Care | Payer: 59 | Source: Ambulatory Visit | Attending: Family | Admitting: Family

## 2022-01-12 VITALS — BP 122/77 | HR 53 | Temp 98.3°F | Ht 66.5 in | Wt 154.4 lb

## 2022-01-12 DIAGNOSIS — R300 Dysuria: Secondary | ICD-10-CM | POA: Insufficient documentation

## 2022-01-12 LAB — POCT URINALYSIS DIPSTICK
Bilirubin, UA: NEGATIVE
Blood, UA: NEGATIVE
Glucose, UA: NEGATIVE
Ketones, UA: NEGATIVE
Leukocytes, UA: NEGATIVE
Nitrite, UA: NEGATIVE
Protein, UA: NEGATIVE
Spec Grav, UA: 1.025 (ref 1.010–1.025)
Urobilinogen, UA: 0.2 E.U./dL
pH, UA: 6 (ref 5.0–8.0)

## 2022-01-12 MED ORDER — FLUCONAZOLE 150 MG PO TABS: ORAL_TABLET | ORAL | 0 refills | Status: DC

## 2022-01-12 NOTE — Progress Notes (Signed)
Patient ID: Jennifer [name], female    DOB: September 08, 1995, 26 y.o.   MRN: [id number]  Chief Complaint  Patient presents with   Dysuria    Pt c/o burning during/after urination, itchy,urinary frequency for about 3 days. Has tried AZO which did help at the time but symptoms came back.    HPI: Urinary symptoms: Patient c/o  dysuria, frequency, urgency. Denies: flank pain, pelvic pain, low back pain, foul odor, cloudy urine, hematuria. Other sx: vaginal d/c: none, vaginal itching: mild. Duration of sx: 3 days; Home tx: AZO x 1d, 2 days ago; Denies  nausea, fever. Reports last UTI over a year ago. Also reports having 2 new sexual partners recently has been using condoms,  hx of Chlamydia in the past.   Assessment & Plan:  1. Dysuria UA negative. Sending out for STD testing as well as yeast and BV, pt has hx of both in past. Will treat for yeast today. Pt advised to call provider on call over weekend if testing comes back positive for anything other than yeast.  - POCT Urinalysis Dipstick - Urine cytology ancillary only - fluconazole (DIFLUCAN) 150 MG tablet; Take 1 pill today and the 2nd pill in 3 days.  Dispense: 2 tablet; Refill: 0  Subjective:    Outpatient Medications Prior to Visit  Medication Sig Dispense Refill   Cranberry 125 MG TABS Take 2 tablets by mouth in the morning and at bedtime.     Probiotic Product (PROBIOTIC-10 PO) Take by mouth.     benzonatate (TESSALON PERLES) 100 MG capsule Take 1 capsule (100 mg total) by mouth 3 (three) times daily as needed for cough. 30 capsule 0   fluticasone (FLONASE) 50 MCG/ACT nasal spray Place 2 sprays into both nostrils daily. (Patient not taking: Reported on 01/02/2022) 16 g 0   ibuprofen (ADVIL) 200 MG tablet Take 600 mg by mouth every 6 (six) hours as needed for moderate pain.  (Patient not taking: Reported on 01/02/2022)     No facility-administered medications prior to visit.   Past Medical History:  Diagnosis Date   Irregular  periods    Kidney stone on right side 12/24/2017   Strabismus    Past Surgical History:  Procedure Laterality Date   EXTRACORPOREAL SHOCK WAVE LITHOTRIPSY Right 01/03/2018   Procedure: EXTRACORPOREAL SHOCK WAVE LITHOTRIPSY (ESWL);  Surgeon: Abbie Sons, MD;  Location: [facility];  Service: Urology;  Laterality: Right;   EYE SURGERY     WISDOM TOOTH EXTRACTION     Allergies  Allergen Reactions   Sulfa Antibiotics Hives      Objective:    Physical Exam Vitals and nursing note reviewed.  Constitutional:      Appearance: Normal appearance.  Cardiovascular:     Rate and Rhythm: Normal rate and regular rhythm.  Pulmonary:     Effort: Pulmonary effort is normal.     Breath sounds: Normal breath sounds.  Musculoskeletal:        General: Normal range of motion.  Skin:    General: Skin is warm and dry.  Neurological:     Mental Status: She is alert.  Psychiatric:        Mood and Affect: Mood normal.        Behavior: Behavior normal.    BP 122/77 (BP Location: Left Arm, Patient Position: Sitting, Cuff Size: Large)   Pulse (!) 53   Temp 98.3 F (36.8 C) (Temporal)   Ht 5' 6.5" (1.689 m)   Wt 154  lb 6.4 oz (70 kg)   LMP 12/31/2021 (Exact Date)   SpO2 100%   BMI 24.55 kg/m  Wt Readings from Last 3 Encounters:  01/12/22 154 lb 6.4 oz (70 kg)  01/02/22 153 lb 6.4 oz (69.6 kg)  08/15/21 159 lb 4 oz (72.2 kg)       Jeanie Sewer, NP

## 2022-01-16 ENCOUNTER — Other Ambulatory Visit: Payer: Self-pay | Admitting: Family

## 2022-01-16 DIAGNOSIS — N760 Acute vaginitis: Secondary | ICD-10-CM

## 2022-01-16 LAB — URINE CYTOLOGY ANCILLARY ONLY
Bacterial Vaginitis-Urine: POSITIVE — AB
Candida Urine: NEGATIVE
Chlamydia: NEGATIVE
Comment: NEGATIVE
Comment: NEGATIVE
Comment: NORMAL
Neisseria Gonorrhea: NEGATIVE
Trichomonas: NEGATIVE

## 2022-01-16 MED ORDER — METRONIDAZOLE 500 MG PO TABS: 500.0000 mg | ORAL_TABLET | Freq: Two times a day (BID) | ORAL | 0 refills | Status: AC

## 2022-01-16 NOTE — Progress Notes (Signed)
Unfortunately your urine is positive for BV, so I have sent over generic Flagyl to start. If you have not taken the 2nd Fluconazole pill, you can hold this. If you have, that is ok, still take the Flagyl.

## 2022-01-30 ENCOUNTER — Ambulatory Visit (INDEPENDENT_AMBULATORY_CARE_PROVIDER_SITE_OTHER): Payer: 59 | Admitting: Physician Assistant

## 2022-01-30 ENCOUNTER — Encounter: Payer: Self-pay | Admitting: Physician Assistant

## 2022-01-30 VITALS — BP 108/72 | HR 76 | Temp 98.2°F | Ht 66.5 in | Wt 154.8 lb

## 2022-01-30 DIAGNOSIS — Z1329 Encounter for screening for other suspected endocrine disorder: Secondary | ICD-10-CM

## 2022-01-30 DIAGNOSIS — Z0000 Encounter for general adult medical examination without abnormal findings: Secondary | ICD-10-CM

## 2022-01-30 DIAGNOSIS — Z13220 Encounter for screening for lipoid disorders: Secondary | ICD-10-CM

## 2022-01-30 DIAGNOSIS — Z1283 Encounter for screening for malignant neoplasm of skin: Secondary | ICD-10-CM

## 2022-01-30 LAB — COMPREHENSIVE METABOLIC PANEL
ALT: 12 U/L (ref 0–35)
AST: 17 U/L (ref 0–37)
Albumin: 4.3 g/dL (ref 3.5–5.2)
Alkaline Phosphatase: 46 U/L (ref 39–117)
BUN: 8 mg/dL (ref 6–23)
CO2: 25 mEq/L (ref 19–32)
Calcium: 9.1 mg/dL (ref 8.4–10.5)
Chloride: 106 mEq/L (ref 96–112)
Creatinine, Ser: 0.74 mg/dL (ref 0.40–1.20)
GFR: 110.81 mL/min (ref >60.00)
Glucose, Bld: 83 mg/dL (ref 70–99)
Potassium: 4.1 mEq/L (ref 3.5–5.1)
Sodium: 138 mEq/L (ref 135–145)
Total Bilirubin: 0.5 mg/dL (ref 0.2–1.2)
Total Protein: 7.1 g/dL (ref 6.0–8.3)

## 2022-01-30 LAB — CBC WITH DIFFERENTIAL/PLATELET
Basophils Absolute: 0 10*3/uL (ref 0.0–0.1)
Basophils Relative: 0.4 % (ref 0.0–3.0)
Eosinophils Absolute: 0.1 10*3/uL (ref 0.0–0.7)
Eosinophils Relative: 1.8 % (ref 0.0–5.0)
HCT: 42.1 % (ref 36.0–46.0)
Hemoglobin: 14.3 g/dL (ref 12.0–15.0)
Lymphocytes Relative: 21.6 % (ref 12.0–46.0)
Lymphs Abs: 1.7 10*3/uL (ref 0.7–4.0)
MCHC: 33.9 g/dL (ref 30.0–36.0)
MCV: 92.4 fl (ref 78.0–100.0)
Monocytes Absolute: 0.7 10*3/uL (ref 0.1–1.0)
Monocytes Relative: 8.5 % (ref 3.0–12.0)
Neutro Abs: 5.5 10*3/uL (ref 1.4–7.7)
Neutrophils Relative %: 67.7 % (ref 43.0–77.0)
Platelets: 249 10*3/uL (ref 150.0–400.0)
RBC: 4.56 Mil/uL (ref 3.87–5.11)
RDW: 13 % (ref 11.5–15.5)
WBC: 8.1 10*3/uL (ref 4.0–10.5)

## 2022-01-30 LAB — LIPID PANEL
Cholesterol: 134 mg/dL (ref 0–200)
HDL: 56 mg/dL (ref >39.00)
LDL Cholesterol: 72 mg/dL (ref 0–99)
NonHDL: 78.29
Total CHOL/HDL Ratio: 2
Triglycerides: 32 mg/dL (ref 0.0–149.0)
VLDL: 6.4 mg/dL (ref 0.0–40.0)

## 2022-01-30 LAB — TSH: TSH: 0.94 u[IU]/mL (ref 0.35–5.50)

## 2022-01-30 NOTE — Patient Instructions (Signed)
Welcome to [facility]! It was a pleasure seeing you again today!  As discussed, Please schedule a 12 month follow up visit today.  PLEASE NOTE:  If you had any LAB tests please let us know if you have not heard back within a few days. You may see your results on MyChart before we have a chance to review them but we will give you a call once they are reviewed by [place]. If we ordered any REFERRALS today, please let us know if you have not heard from their office within the next two weeks. Let us know through MyChart if you are needing REFILLS, or have your pharmacy send [place] the request. You can also use MyChart to communicate with me or any office staff.  Please try these tips to maintain a healthy lifestyle:  Eat most of your calories during the day when you are active. Eliminate processed foods including packaged sweets (pies, cakes, cookies), reduce intake of potatoes, white bread, white pasta, and white rice. Look for whole grain options, oat flour or almond flour.  Each meal should contain half fruits/vegetables, one quarter protein, and one quarter carbs (no bigger than a computer mouse).  Cut down on sweet beverages. This includes juice, soda, and sweet tea. Also watch fruit intake, though this is a healthier sweet option, it still contains natural sugar! Limit to 3 servings daily.  Drink at least 1 glass of water with each meal and aim for at least 8 glasses (64 ounces) per day.  Exercise at least 150 minutes every week to the best of your ability.    Take Care,  Koal Eslinger, PA-C

## 2022-01-30 NOTE — Progress Notes (Signed)
Subjective:    Patient ID: Jennifer [name], female    DOB: Oct 21, 1995, 26 y.o.   MRN: [id number]  Chief Complaint  Patient presents with   Transitions Of Care    Former [facility] pt TOC to [place]; prev had sick visit for cough; pt is fasting;     HPI 26 y.o. patient presents today for new patient establishment with me.  Patient was previously established with Allie Bossier, NP.  Current Care Team: Dr. Linda Hedges - GYN, usually annually  Would like dermatology referral for annual skin exams   Acute concerns: No acute concerns  Health maintenance: Lifestyle/ exercise: Regular - [employer], stair-climber, light weights Nutrition: Eggs, avocado; chicken & rice; occ fast food; well-balanced; water, no sodas or juice Mental health: Stress & anxiety - recently started looking into counseling Caffeine: Coffee daily Substance use: None Sexual activity: 1-2 partners; condoms Immunizations: Flu vaccine advised this fall Pap: Sees GYN    Past Medical History:  Diagnosis Date   History of recurrent UTIs    Irregular periods    started irregular as a teenager; then went on OCPs, stopped in 2019, has been regular since then   Kidney stone on right side 12/24/2017   Strabismus     Past Surgical History:  Procedure Laterality Date   EXTRACORPOREAL SHOCK WAVE LITHOTRIPSY Right 01/03/2018   Procedure: EXTRACORPOREAL SHOCK WAVE LITHOTRIPSY (ESWL);  Surgeon: Abbie Sons, MD;  Location: [facility];  Service: Urology;  Laterality: Right;   EYE SURGERY     LEEP     WISDOM TOOTH EXTRACTION      Family History  Problem Relation Age of Onset   Alcohol abuse Mother    Kidney failure Father    Skin cancer Father    Dementia Maternal Grandmother    Skin cancer Paternal Grandfather    Heart Problems Paternal Grandfather     Social History   Tobacco Use   Smoking status: Former    Years: 3.00    Types: Cigarettes    Quit date: 03/20/2015    Years since quitting: 6.8    Smokeless tobacco: Never  Vaping Use   Vaping Use: Never used  Substance Use Topics   Alcohol use: Yes    Alcohol/week: 0.0 standard drinks of alcohol    Comment: occ   Drug use: No     Allergies  Allergen Reactions   Sulfa Antibiotics Hives    Review of Systems NEGATIVE UNLESS OTHERWISE INDICATED IN HPI      Objective:     BP 108/72 (BP Location: Right Arm)   Pulse 76   Temp 98.2 F (36.8 C) (Temporal)   Ht 5' 6.5" (1.689 m)   Wt 154 lb 12.8 oz (70.2 kg)   LMP 01/30/2022 (Exact Date)   SpO2 98%   BMI 24.61 kg/m   Wt Readings from Last 3 Encounters:  01/30/22 154 lb 12.8 oz (70.2 kg)  01/12/22 154 lb 6.4 oz (70 kg)  01/02/22 153 lb 6.4 oz (69.6 kg)    BP Readings from Last 3 Encounters:  01/30/22 108/72  01/12/22 122/77  01/02/22 118/74     Physical Exam Vitals and nursing note reviewed.  Constitutional:      Appearance: Normal appearance. She is normal weight. She is not toxic-appearing.  HENT:     Head: Normocephalic and atraumatic.     Right Ear: Tympanic membrane, ear canal and external ear normal.     Left Ear: Tympanic membrane, ear canal  and external ear normal.     Nose: Nose normal.     Mouth/Throat:     Mouth: Mucous membranes are moist.  Eyes:     Extraocular Movements: Extraocular movements intact.     Conjunctiva/sclera: Conjunctivae normal.     Pupils: Pupils are equal, round, and reactive to light.  Cardiovascular:     Rate and Rhythm: Normal rate and regular rhythm.     Pulses: Normal pulses.     Heart sounds: Normal heart sounds.  Pulmonary:     Effort: Pulmonary effort is normal.     Breath sounds: Normal breath sounds.  Abdominal:     General: Abdomen is flat. Bowel sounds are normal.     Palpations: Abdomen is soft.  Musculoskeletal:        General: Normal range of motion.     Cervical back: Normal range of motion and neck supple.     Right lower leg: No edema.     Left lower leg: No edema.  Skin:    General: Skin is  warm and dry.     Findings: No lesion or rash.  Neurological:     General: No focal deficit present.     Mental Status: She is alert and oriented to person, place, and time.  Psychiatric:        Mood and Affect: Mood normal.        Behavior: Behavior normal.        Thought Content: Thought content normal.        Judgment: Judgment normal.        Assessment & Plan:   Problem List Items Addressed This Visit       Other   Encounter for annual physical exam - Primary    Age-appropriate screening and counseling performed today. Will check labs and call with results. Preventive measures discussed and printed in AVS for patient.  Patient Counseling: [x]   Nutrition: Stressed importance of moderation in sodium/caffeine intake, saturated fat and cholesterol, caloric balance, sufficient intake of fresh fruits, vegetables, and fiber.  [x]   Stressed the importance of regular exercise.   [x]   Substance Abuse: Discussed cessation/primary prevention of tobacco, alcohol, or other drug use; driving or other dangerous activities under the influence; availability of treatment for abuse.   []   Injury prevention: Discussed safety belts, safety helmets, smoke detector, smoking near bedding or upholstery.   [x]   Sexuality: Discussed sexually transmitted diseases, partner selection, use of condoms, avoidance of unintended pregnancy  and contraceptive alternatives.   [x]   Dental health: Discussed importance of regular tooth brushing, flossing, and dental visits.  [x]   Health maintenance and immunizations reviewed. Please refer to Health maintenance section.          Relevant Orders   CBC with Differential/Platelet   TSH   Lipid panel   Comp Met (CMET)   Other Visit Diagnoses     Skin cancer screening       Relevant Orders   Ambulatory referral to Dermatology   Screening for cholesterol level       Relevant Orders   Lipid panel   Thyroid disorder screen       Relevant Orders   TSH         Return in about 1 year (around 01/31/2023) for fasting labs and CPE .    Calle Schader M Darl Brisbin, PA-C

## 2022-01-30 NOTE — Assessment & Plan Note (Signed)
Age-appropriate screening and counseling performed today. Will check labs and call with results. Preventive measures discussed and printed in AVS for patient.  Patient Counseling: '[x]'$   Nutrition: Stressed importance of moderation in sodium/caffeine intake, saturated fat and cholesterol, caloric balance, sufficient intake of fresh fruits, vegetables, and fiber.  '[x]'$   Stressed the importance of regular exercise.   '[x]'$   Substance Abuse: Discussed cessation/primary prevention of tobacco, alcohol, or other drug use; driving or other dangerous activities under the influence; availability of treatment for abuse.   '[]'$   Injury prevention: Discussed safety belts, safety helmets, smoke detector, smoking near bedding or upholstery.   '[x]'$   Sexuality: Discussed sexually transmitted diseases, partner selection, use of condoms, avoidance of unintended pregnancy  and contraceptive alternatives.   '[x]'$   Dental health: Discussed importance of regular tooth brushing, flossing, and dental visits.  '[x]'$   Health maintenance and immunizations reviewed. Please refer to Health maintenance section.

## 2022-02-27 ENCOUNTER — Encounter: Payer: Self-pay | Admitting: *Deleted

## 2022-05-18 ENCOUNTER — Encounter: Payer: Self-pay | Admitting: *Deleted

## 2022-06-07 ENCOUNTER — Ambulatory Visit: Payer: 59 | Admitting: Physician Assistant

## 2022-06-12 ENCOUNTER — Ambulatory Visit (INDEPENDENT_AMBULATORY_CARE_PROVIDER_SITE_OTHER): Payer: 59 | Admitting: Physician Assistant

## 2022-06-12 VITALS — BP 116/78 | HR 81 | Temp 98.4°F | Ht 66.5 in | Wt 152.4 lb

## 2022-06-12 DIAGNOSIS — Z1283 Encounter for screening for malignant neoplasm of skin: Secondary | ICD-10-CM

## 2022-06-12 DIAGNOSIS — R509 Fever, unspecified: Secondary | ICD-10-CM | POA: Diagnosis not present

## 2022-06-12 DIAGNOSIS — R051 Acute cough: Secondary | ICD-10-CM | POA: Diagnosis not present

## 2022-06-12 DIAGNOSIS — M5441 Lumbago with sciatica, right side: Secondary | ICD-10-CM | POA: Diagnosis not present

## 2022-06-12 LAB — POC COVID19 BINAXNOW: SARS Coronavirus 2 Ag: NEGATIVE

## 2022-06-12 LAB — POCT INFLUENZA A/B
Influenza A, POC: NEGATIVE
Influenza B, POC: NEGATIVE

## 2022-06-12 NOTE — Progress Notes (Signed)
Subjective:    Patient ID: Jennifer [name], female    DOB: 07-Aug-1995, 27 y.o.   MRN: [id number]  Chief Complaint  Patient presents with   Back Pain    Pt in office for recent back pain; Also states since making appt has got sick; pt is having fever broke this morning, cough, muscle aches, fatigue, and headache, just feeling very weak with dry throat as well. Pt did  not get flu shot this year.     Back Pain   Patient is in today for acute sick symptoms.   Chief complaint: Headache, weak, fatigued  Symptom onset: 06/11/22 Pertinent positives: Fever, headache, tired, nasal congestion and cough, dry throat Pertinent negatives: SOB, CP, n/v/d, abd pain Treatments tried: Meloxicam yesterday  Vaccine status: not UTD on flu or covid vaccines Sick exposure: sick co-workers - COVID and flu going around   Home COVID-19 test negative yesterday.   Also having intermittent R sided low back pain with sciatica going on x 1 month. NKI she is aware of, but may have scoliosis history. Has been trying home exercise and OTC treatments with minimal relief.  Needing derm referral for general skin ca screening.    Past Medical History:  Diagnosis Date   History of recurrent UTIs    Irregular periods    started irregular as a teenager; then went on OCPs, stopped in 2019, has been regular since then   Kidney stone on right side 12/24/2017   Strabismus     Past Surgical History:  Procedure Laterality Date   EXTRACORPOREAL SHOCK WAVE LITHOTRIPSY Right 01/03/2018   Procedure: EXTRACORPOREAL SHOCK WAVE LITHOTRIPSY (ESWL);  Surgeon: Abbie Sons, MD;  Location: [facility];  Service: Urology;  Laterality: Right;   EYE SURGERY     LEEP     WISDOM TOOTH EXTRACTION      Family History  Problem Relation Age of Onset   Alcohol abuse Mother    Kidney failure Father    Skin cancer Father    Dementia Maternal Grandmother    Skin cancer Paternal Grandfather    Heart Problems Paternal  Grandfather     Social History   Tobacco Use   Smoking status: Former    Years: 3.00    Types: Cigarettes    Quit date: 03/20/2015    Years since quitting: 7.2   Smokeless tobacco: Never  Vaping Use   Vaping Use: Never used  Substance Use Topics   Alcohol use: Yes    Alcohol/week: 0.0 standard drinks of alcohol    Comment: occ   Drug use: No     Allergies  Allergen Reactions   Sulfa Antibiotics Hives    Review of Systems  Musculoskeletal:  Positive for back pain.   NEGATIVE UNLESS OTHERWISE INDICATED IN HPI      Objective:     BP 116/78 (BP Location: Left Arm)   Pulse 81   Temp 98.4 F (36.9 C) (Temporal)   Ht 5' 6.5" (1.689 m)   Wt 152 lb 6.4 oz (69.1 kg)   SpO2 97%   BMI 24.23 kg/m   Wt Readings from Last 3 Encounters:  06/12/22 152 lb 6.4 oz (69.1 kg)  01/30/22 154 lb 12.8 oz (70.2 kg)  01/12/22 154 lb 6.4 oz (70 kg)    BP Readings from Last 3 Encounters:  06/12/22 116/78  01/30/22 108/72  01/12/22 122/77     Physical Exam Vitals and nursing note reviewed.  Constitutional:  Appearance: Normal appearance. She is normal weight. She is ill-appearing. She is not toxic-appearing.  HENT:     Head: Normocephalic and atraumatic.     Right Ear: Tympanic membrane, ear canal and external ear normal.     Left Ear: Tympanic membrane, ear canal and external ear normal.     Nose: Nose normal.     Mouth/Throat:     Mouth: Mucous membranes are moist.  Eyes:     Extraocular Movements: Extraocular movements intact.     Conjunctiva/sclera: Conjunctivae normal.     Pupils: Pupils are equal, round, and reactive to light.  Cardiovascular:     Rate and Rhythm: Normal rate and regular rhythm.     Pulses: Normal pulses.     Heart sounds: Normal heart sounds.  Pulmonary:     Effort: Pulmonary effort is normal.     Breath sounds: Normal breath sounds.  Musculoskeletal:     Cervical back: Normal range of motion and neck supple.     Lumbar back: No bony  tenderness. Normal range of motion. Negative right straight leg raise test and negative left straight leg raise test.     Right lower leg: No edema.     Left lower leg: No edema.  Skin:    General: Skin is warm and dry.  Neurological:     General: No focal deficit present.     Mental Status: She is alert and oriented to person, place, and time.  Psychiatric:        Mood and Affect: Mood normal.        Behavior: Behavior normal.        Assessment & Plan:  Fever, unspecified fever cause -     POCT Influenza A/B -     POC COVID-19 BinaxNow  Acute cough -     POCT Influenza A/B -     POC COVID-19 BinaxNow  Right-sided low back pain with right-sided sciatica, unspecified chronicity -     DG Lumbar Spine Complete; Future  Skin cancer screening -     Ambulatory referral to Dermatology   Reassured pt POC COVID and flu testing negative, but may be false neg due to early testing. She will recheck at home tomorrow. Advised conservative care including fluids, rest, Tylenol / ibuprofen. Recheck prn. Work note provided.  Negative for any fevers, saddle anesthesia, foot-drop, incontinence of urine or stool, hx of cancer, and no recent injury. XRAY L-spine advised at [facility]. Pending XRAY results, may need further treatment /work up.  Referral to derm as requested.      Return if symptoms worsen or fail to improve.  This note was prepared with assistance of Systems analyst. Occasional wrong-word or sound-a-like substitutions may have occurred due to the inherent limitations of voice recognition software.     Manuel Dall M Jonovan Boedecker, PA-C

## 2022-07-03 ENCOUNTER — Encounter: Payer: Self-pay | Admitting: Physician Assistant

## 2022-07-03 ENCOUNTER — Other Ambulatory Visit: Payer: Self-pay

## 2022-07-03 ENCOUNTER — Ambulatory Visit (INDEPENDENT_AMBULATORY_CARE_PROVIDER_SITE_OTHER): Payer: 59 | Admitting: Physician Assistant

## 2022-07-03 VITALS — BP 128/82 | HR 98 | Temp 98.0°F | Ht 66.5 in | Wt 156.0 lb

## 2022-07-03 DIAGNOSIS — D225 Melanocytic nevi of trunk: Secondary | ICD-10-CM | POA: Diagnosis not present

## 2022-07-03 DIAGNOSIS — D485 Neoplasm of uncertain behavior of skin: Secondary | ICD-10-CM

## 2022-07-03 DIAGNOSIS — J3489 Other specified disorders of nose and nasal sinuses: Secondary | ICD-10-CM

## 2022-07-03 NOTE — Progress Notes (Unsigned)
Subjective:    Patient ID: Jennifer [name], female    DOB: 03-07-1996, 27 y.o.   MRN: [id number]  Chief Complaint  Patient presents with   mole removal    Pt in office for mole removal on lower back, right lower hip, one under left breast, and one under right axillary area;     HPI Patient is in today for a few skin concerns. She has some moles she would like looked at. Also has a ?mass on R side of nose that has been there a few years. No pain with any lesions. No fever or changes in color or size to any lesion.   Past Medical History:  Diagnosis Date   History of recurrent UTIs    Irregular periods    started irregular as a teenager; then went on OCPs, stopped in 2019, has been regular since then   Kidney stone on right side 12/24/2017   Strabismus     Past Surgical History:  Procedure Laterality Date   EXTRACORPOREAL SHOCK WAVE LITHOTRIPSY Right 01/03/2018   Procedure: EXTRACORPOREAL SHOCK WAVE LITHOTRIPSY (ESWL);  Surgeon: Abbie Sons, MD;  Location: [facility];  Service: Urology;  Laterality: Right;   EYE SURGERY     LEEP     WISDOM TOOTH EXTRACTION      Family History  Problem Relation Age of Onset   Alcohol abuse Mother    Kidney failure Father    Skin cancer Father    Dementia Maternal Grandmother    Skin cancer Paternal Grandfather    Heart Problems Paternal Grandfather     Social History   Tobacco Use   Smoking status: Former    Years: 3.00    Types: Cigarettes    Quit date: 03/20/2015    Years since quitting: 7.2   Smokeless tobacco: Never  Vaping Use   Vaping Use: Never used  Substance Use Topics   Alcohol use: Yes    Alcohol/week: 0.0 standard drinks of alcohol    Comment: occ   Drug use: No     Allergies  Allergen Reactions   Sulfa Antibiotics Hives    Review of Systems NEGATIVE UNLESS OTHERWISE INDICATED IN HPI      Objective:     BP 128/82 (BP Location: Left Arm)   Pulse 98   Temp 98 F (36.7 C) (Temporal)   Ht 5'  6.5" (1.689 m)   Wt 156 lb (70.8 kg)   LMP 05/31/2022 (Approximate)   SpO2 99%   BMI 24.80 kg/m   Wt Readings from Last 3 Encounters:  07/03/22 156 lb (70.8 kg)  06/12/22 152 lb 6.4 oz (69.1 kg)  01/30/22 154 lb 12.8 oz (70.2 kg)    BP Readings from Last 3 Encounters:  07/03/22 128/82  06/12/22 116/78  01/30/22 108/72     Physical Exam Vitals and nursing note reviewed.  Constitutional:      Appearance: Normal appearance.  HENT:     Nose:   Cardiovascular:     Rate and Rhythm: Normal rate and regular rhythm.     Pulses: Normal pulses.  Pulmonary:     Effort: Pulmonary effort is normal.     Breath sounds: Normal breath sounds.  Skin:      Neurological:     Mental Status: She is alert.        Assessment & Plan:  Neoplasm of uncertain behavior of skin -     Dermatology pathology -     Dermatology pathology  Nasal mass -     Ambulatory referral to ENT   Reassured patient most likely benign skin lesions, however, patient is requesting removal, and with slight color changes superficially, will be on safe side remove these lesions.  #1 - RLQ abdomen as noted above Procedure explained and consent obtained. Area cleaned in usual manner. Local anesthesia with 2 cc of Lidocaine 1% with Epi, using a 25 gauge needle. Dermablade used to remove the lesion. Hemostasis with Drysol. Band-Aid with Vaseline applied. Patient tolerated procedure well. Aftercare instructions provided. Lesion sent for pathology.   #2 - Low back lesion as noted above Procedure explained and consent obtained. Area cleaned in usual manner. Local anesthesia with 3 cc of Lidocaine 1% with Epi, using a 25 gauge needle. Dermablade used to remove the lesion. Hemostasis with Drysol. Band-Aid with Vaseline applied. Patient tolerated procedure well. Aftercare instructions provided. Lesion sent for pathology.  Nasal ?mass - likely benign, but will send referral to ENT to evaluate further.    Return if  symptoms worsen or fail to improve.     Ricketta Colantonio M Skyrah Krupp, PA-C

## 2022-07-03 NOTE — Patient Instructions (Signed)
Great to see you! See care instructions.  Referral also sent to ENT.   Call if any concerns

## 2022-07-05 ENCOUNTER — Encounter: Payer: Self-pay | Admitting: Physician Assistant

## 2022-08-01 NOTE — Progress Notes (Signed)
Jennifer [name] is a 27 y.o. female here for a new problem.  History of Present Illness:   Chief Complaint  Patient presents with   Dysuria    Pt c/o burning with urination and frequency, started on Friday 2/23. Took Azo yesterday and started period today.    HPI  Urinary Symptoms Burning upon urination, frequency started 07/28/22. Symptoms were occurring every other day. Burning now continues even when not urinating. Denies nausea, vomiting, fever, chills. Reports h/o UTIs. Took Azo yesterday.  Reports h/o recurring kidney infections from kidney stone.  Has taken probiotic and cranberry pills in the past, which helped prevent UTIs.  She also has hx of BV and would like testing for that as well as STIs  Past Medical History:  Diagnosis Date   History of recurrent UTIs    Irregular periods    started irregular as a teenager; then went on OCPs, stopped in 2019, has been regular since then   Kidney stone on right side 12/24/2017   Strabismus      Social History   Tobacco Use   Smoking status: Former    Years: 3.00    Types: Cigarettes    Quit date: 03/20/2015    Years since quitting: 7.3   Smokeless tobacco: Never  Vaping Use   Vaping Use: Never used  Substance Use Topics   Alcohol use: Yes    Alcohol/week: 0.0 standard drinks of alcohol    Comment: occ   Drug use: No    Past Surgical History:  Procedure Laterality Date   EXTRACORPOREAL SHOCK WAVE LITHOTRIPSY Right 01/03/2018   Procedure: EXTRACORPOREAL SHOCK WAVE LITHOTRIPSY (ESWL);  Surgeon: Riki Altes, MD;  Location: [facility] ORS;  Service: Urology;  Laterality: Right;   EYE SURGERY     LEEP     WISDOM TOOTH EXTRACTION      Family History  Problem Relation Age of Onset   Alcohol abuse Mother    Kidney failure Father    Skin cancer Father    Dementia Maternal Grandmother    Skin cancer Paternal Grandfather    Heart Problems Paternal Grandfather     Allergies  Allergen Reactions   Sulfa  Antibiotics Hives    Current Medications:   Current Outpatient Medications:    Cranberry 125 MG TABS, Take 2 tablets by mouth daily., Disp: , Rfl:    Probiotic Product (PROBIOTIC-10 PO), Take by mouth., Disp: , Rfl:    Review of Systems:   Review of Systems  Constitutional:  Negative for chills, fever and malaise/fatigue.  HENT:  Negative for congestion.   Eyes:  Negative for blurred vision.  Respiratory:  Negative for cough and shortness of breath.   Cardiovascular:  Negative for chest pain, palpitations and leg swelling.  Gastrointestinal:  Negative for nausea and vomiting.  Genitourinary:  Positive for dysuria and frequency.  Musculoskeletal:  Negative for back pain.  Skin:  Negative for rash.  Neurological:  Negative for loss of consciousness and headaches.    Vitals:   Vitals:   08/02/22 0857  BP: 122/80  Pulse: 74  Temp: 97.7 F (36.5 C)  TempSrc: Temporal  SpO2: 99%  Weight: 155 lb 4 oz (70.4 kg)  Height: 5' 6.5" (1.689 m)     Body mass index is 24.68 kg/m.  Physical Exam:   Physical Exam Vitals and nursing note reviewed.  Constitutional:      General: She is not in acute distress.    Appearance: She is well-developed. She  is not ill-appearing or toxic-appearing.  Cardiovascular:     Rate and Rhythm: Normal rate and regular rhythm.     Pulses: Normal pulses.     Heart sounds: Normal heart sounds, S1 normal and S2 normal.  Pulmonary:     Effort: Pulmonary effort is normal.     Breath sounds: Normal breath sounds.  Abdominal:     Tenderness: There is no right CVA tenderness or left CVA tenderness.  Skin:    General: Skin is warm and dry.  Neurological:     Mental Status: She is alert.     GCS: GCS eye subscore is 4. GCS verbal subscore is 5. GCS motor subscore is 6.  Psychiatric:        Speech: Speech normal.        Behavior: Behavior normal. Behavior is cooperative.     Results for orders placed or performed in visit on 08/02/22  POCT  urinalysis dipstick  Result Value Ref Range   Color, UA amber    Clarity, UA cloudy    Glucose, UA Negative Negative   Bilirubin, UA Negative    Ketones, UA Negative    Spec Grav, UA 1.015 1.010 - 1.025   Blood, UA 3    pH, UA 6.0 5.0 - 8.0   Protein, UA Negative Negative   Urobilinogen, UA 0.2 0.2 or 1.0 E.U./dL   Nitrite, UA Positive    Leukocytes, UA Moderate (2+) (A) Negative   Appearance     Odor       Assessment and Plan:   Dysuria No red flags UA results are consistent with acute cystitis Will plan to start empiric Keflex 500 mg twice daily Urine culture pending and will change treatment based on results  Screening examination for STD (sexually transmitted disease) Update testing per patient request She started her period today and has a very heavy flow so we decided to capture STD testing through her urine instead of the cervical swab She declines blood HIV and RPR testing today  I,Alexander Ruley,acting as a scribe for [employer], PA.,have documented all relevant documentation on the behalf of Jarold Motto, PA,as directed by  Jarold Motto, PA while in the presence of Jarold Motto, [place].  I, Jarold Motto, [place], have reviewed all documentation for this visit. The documentation on 08/02/22 for the exam, diagnosis, procedures, and orders are all accurate and complete.  Jarold Motto, PA-C

## 2022-08-02 ENCOUNTER — Encounter: Payer: Self-pay | Admitting: Physician Assistant

## 2022-08-02 ENCOUNTER — Other Ambulatory Visit (HOSPITAL_COMMUNITY)
Admission: RE | Admit: 2022-08-02 | Discharge: 2022-08-02 | Disposition: A | Discharge status: Home / Self Care | Payer: 59 | Source: Ambulatory Visit | Attending: Physician Assistant | Admitting: Physician Assistant

## 2022-08-02 ENCOUNTER — Ambulatory Visit (INDEPENDENT_AMBULATORY_CARE_PROVIDER_SITE_OTHER): Payer: 59 | Admitting: Physician Assistant

## 2022-08-02 VITALS — BP 122/80 | HR 74 | Temp 97.7°F | Ht 66.5 in | Wt 155.2 lb

## 2022-08-02 DIAGNOSIS — R300 Dysuria: Secondary | ICD-10-CM

## 2022-08-02 DIAGNOSIS — Z113 Encounter for screening for infections with a predominantly sexual mode of transmission: Secondary | ICD-10-CM

## 2022-08-02 LAB — POCT URINALYSIS DIPSTICK
Bilirubin, UA: NEGATIVE
Blood, UA: 3
Glucose, UA: NEGATIVE
Ketones, UA: NEGATIVE
Nitrite, UA: POSITIVE
Protein, UA: NEGATIVE
Spec Grav, UA: 1.015 (ref 1.010–1.025)
Urobilinogen, UA: 0.2 E.U./dL
pH, UA: 6 (ref 5.0–8.0)

## 2022-08-02 MED ORDER — CEPHALEXIN 500 MG PO CAPS: 500.0000 mg | ORAL_CAPSULE | Freq: Two times a day (BID) | ORAL | 0 refills | Status: DC

## 2022-08-02 NOTE — Patient Instructions (Signed)
It was great to see you!  Start cephalexin antibiotic  We will be in touch with results and the plan  General instructions Make sure you: Pee until your bladder is empty. Do not hold pee for a long time. Empty your bladder after sex. Wipe from front to back after pooping if you are a female. Use each tissue one time when you wipe. Drink enough fluid to keep your pee pale yellow. Keep all follow-up visits as told by your doctor. This is important. Contact a doctor if: You do not get better after 1-2 days. Your symptoms go away and then come back. Get help right away if: You have very bad back pain. You have very bad pain in your lower belly. You have a fever. You are sick to your stomach (nauseous). You are throwing up.   Take care,  Inda Coke PA-C

## 2022-08-03 LAB — URINE CYTOLOGY ANCILLARY ONLY
Bacterial Vaginitis-Urine: NEGATIVE
Candida Urine: NEGATIVE
Chlamydia: NEGATIVE
Comment: NEGATIVE
Comment: NEGATIVE
Comment: NORMAL
Neisseria Gonorrhea: NEGATIVE
Trichomonas: NEGATIVE

## 2022-08-04 LAB — URINE CULTURE
MICRO NUMBER:: 14626677
SPECIMEN QUALITY:: ADEQUATE

## 2022-11-06 ENCOUNTER — Ambulatory Visit (INDEPENDENT_AMBULATORY_CARE_PROVIDER_SITE_OTHER): Payer: 59 | Admitting: Family Medicine

## 2022-11-06 ENCOUNTER — Other Ambulatory Visit (HOSPITAL_COMMUNITY)
Admission: RE | Admit: 2022-11-06 | Discharge: 2022-11-06 | Disposition: A | Discharge status: Home / Self Care | Payer: 59 | Source: Ambulatory Visit | Attending: Family Medicine | Admitting: Family Medicine

## 2022-11-06 ENCOUNTER — Encounter: Payer: Self-pay | Admitting: Family Medicine

## 2022-11-06 VITALS — BP 117/80 | HR 84 | Temp 98.4°F | Resp 16 | Ht 67.0 in | Wt 153.0 lb

## 2022-11-06 DIAGNOSIS — N898 Other specified noninflammatory disorders of vagina: Secondary | ICD-10-CM | POA: Insufficient documentation

## 2022-11-06 NOTE — Patient Instructions (Signed)
Will treat depending on results.

## 2022-11-06 NOTE — Progress Notes (Signed)
   Subjective:     Patient ID: Jennifer [name], female    DOB: 1995-08-08, 27 y.o.   MRN: [id number]  Chief Complaint  Patient presents with   Vaginal Itching    Symptoms started Friday      HPI Vaginal itching since 5/31-better now.  Took diflucan.  Slight "uncomfortable" feeling.  Has had bacterial vaginosis in past.  Feels like may have that.  Has 1 partner for 6 months.  In [employer].  Dr. Langston Masker at [facility] 09/27/22 in notes.  And patient phone results. Negative pap and negative human papilloma virus.   No odor.  Some white D/C.   There are no preventive care reminders to display for this patient.   Past Medical History:  Diagnosis Date   History of recurrent UTIs    Irregular periods    started irregular as a teenager; then went on OCPs, stopped in 2019, has been regular since then   Kidney stone on right side 12/24/2017   Strabismus     Past Surgical History:  Procedure Laterality Date   EXTRACORPOREAL SHOCK WAVE LITHOTRIPSY Right 01/03/2018   Procedure: EXTRACORPOREAL SHOCK WAVE LITHOTRIPSY (ESWL);  Surgeon: Riki Altes, MD;  Location: [facility] OR[facility];  Service: Urology;  Laterality: Right;   EYE SURGERY     LEEP     WISDOM TOOTH EXTRACTION       Current Outpatient Medications:    Cranberry 125 MG TABS, Take 2 tablets by mouth daily., Disp: , Rfl:    Probiotic Product (PROBIOTIC-10 PO), Take by mouth., Disp: , Rfl:   Allergies  Allergen Reactions   Sulfa Antibiotics Hives   ROS neg/noncontributory except as noted HPI/below      Objective:     BP 117/80   Pulse 84   Temp 98.4 F (36.9 C) (Temporal)   Resp 16   Ht 5\' 7"  (1.702 m)   Wt 153 lb (69.4 kg)   SpO2 99%   BMI 23.96 kg/m  Wt Readings from Last 3 Encounters:  11/06/22 153 lb (69.4 kg)  08/02/22 155 lb 4 oz (70.4 kg)  07/03/22 156 lb (70.8 kg)    Physical Exam   Gen: WDWN NAD HEENT: NCAT, conjunctiva not injected, sclera nonicteric ABDOMEN:  BS+, soft, NTND, No HSM, no  masses EXT:  no edema MSK: no gross abnormalities.  NEURO: A&O x3.  CN II-XII intact.  PSYCH: normal mood. Good eye contact     Assessment & Plan:  Vaginal discharge -     Cervicovaginal ancillary only   Vaginal due to.  Patient self-tx'd w/diflucan.  Still some symptoms.  Has had intermitt bacterial vaginosis and yeast.  Discussed hygiene(already doing).  Can follow up gynecology to balance.  Will check swab for GC/chl/bacterial vaginosis,yeast, trich.  Await results for treatment(s)   No follow-ups on file.  Angelena Sole, MD

## 2022-11-07 LAB — CERVICOVAGINAL ANCILLARY ONLY
Bacterial Vaginitis (gardnerella): POSITIVE — AB
Candida Glabrata: NEGATIVE
Candida Vaginitis: NEGATIVE
Chlamydia: NEGATIVE
Comment: NEGATIVE
Comment: NEGATIVE
Comment: NEGATIVE
Comment: NEGATIVE
Comment: NEGATIVE
Comment: NORMAL
Neisseria Gonorrhea: NEGATIVE
Trichomonas: NEGATIVE

## 2022-11-08 ENCOUNTER — Telehealth: Payer: Self-pay | Admitting: Physician Assistant

## 2022-11-08 ENCOUNTER — Other Ambulatory Visit: Payer: Self-pay | Admitting: Physician Assistant

## 2022-11-08 MED ORDER — METRONIDAZOLE 0.75 % VA GEL: 1.0000 | Freq: Every day | VAGINAL | 0 refills | Status: AC

## 2022-11-08 NOTE — Telephone Encounter (Signed)
Was speaking with patient and phone got disconnected. Patient stated that she wanted whichever medication was most effective to be sent to the pharmacy. See result note.

## 2022-11-08 NOTE — Telephone Encounter (Signed)
Patient returned call. Requests to be called. 

## 2022-11-08 NOTE — Telephone Encounter (Signed)
Called pt and advised, pt verbalized understanding.  

## 2022-11-08 NOTE — Telephone Encounter (Signed)
Returned pt call, she thought I was calling about medication for BV. Pt asking is there a difference in which medication works better pill form or cream. Please advise

## 2023-01-22 ENCOUNTER — Encounter: Payer: Self-pay | Admitting: Family Medicine

## 2023-01-22 ENCOUNTER — Ambulatory Visit: Payer: 59 | Admitting: Family Medicine

## 2023-01-22 ENCOUNTER — Other Ambulatory Visit (HOSPITAL_COMMUNITY)
Admission: RE | Admit: 2023-01-22 | Discharge: 2023-01-22 | Disposition: A | Discharge status: Home / Self Care | Payer: 59 | Source: Ambulatory Visit | Attending: Family Medicine | Admitting: Family Medicine

## 2023-01-22 VITALS — BP 113/80 | HR 92 | Temp 98.3°F | Ht 67.0 in | Wt 148.8 lb

## 2023-01-22 DIAGNOSIS — Z7251 High risk heterosexual behavior: Secondary | ICD-10-CM | POA: Diagnosis not present

## 2023-01-22 DIAGNOSIS — N898 Other specified noninflammatory disorders of vagina: Secondary | ICD-10-CM

## 2023-01-22 LAB — POC URINALSYSI DIPSTICK (AUTOMATED)
Bilirubin, UA: NEGATIVE
Blood, UA: NEGATIVE
Glucose, UA: NEGATIVE
Ketones, UA: NEGATIVE
Leukocytes, UA: NEGATIVE
Nitrite, UA: NEGATIVE
Protein, UA: NEGATIVE
Spec Grav, UA: 1.02 (ref 1.010–1.025)
Urobilinogen, UA: 0.2 E.U./dL
pH, UA: 6 (ref 5.0–8.0)

## 2023-01-22 LAB — POCT URINE PREGNANCY: Preg Test, Ur: NEGATIVE

## 2023-01-22 MED ORDER — METRONIDAZOLE 500 MG PO TABS: 500.0000 mg | ORAL_TABLET | Freq: Two times a day (BID) | ORAL | 0 refills | Status: AC

## 2023-01-22 NOTE — Patient Instructions (Signed)

## 2023-01-22 NOTE — Progress Notes (Signed)
Subjective:     Patient ID: Jennifer [name], female    DOB: 04-May-1996, 27 y.o.   MRN: [id number]  Chief Complaint  Patient presents with   Possible UTI   Vaginal Itching    Started yesterday.   Urinary Frequency   Urinary Retention   Burning with urination    All other symptoms started Friday.    HPI  Possible UTI - She reports frequency, retention, burning sensation starting Friday 8/16, vaginal itching started yesterday. Mostly itchiness, burning, and discomfort, more than pain. States she has some discharge, a mixture of thick and white liquid discharge. No odor. Has some cramps which she believes may be attributed to her starting her period soon. Taking probiotics but states she has not been taking as consistently as she should. Has not used her topical yeast infection cream yet, but took one dose of diflucan yesterday morning. Uses [employer] feminine wash. Doesn't use any scented hygiene products or bubble baths. She states she has not had any new partners. No sx from 6/3-8/5. Saw her partner earlier this month. Does not tend to use protection. Not taking any OCPs, has previously had intolerances. Has had similar sx about 2 months ago, hx of BV.   Health Maintenance Due  Topic Date Due   Hepatitis C Screening  Never done    Past Medical History:  Diagnosis Date   History of recurrent UTIs    Irregular periods    started irregular as a teenager; then went on OCPs, stopped in 2019, has been regular since then   Kidney stone on right side 12/24/2017   Strabismus     Past Surgical History:  Procedure Laterality Date   EXTRACORPOREAL SHOCK WAVE LITHOTRIPSY Right 01/03/2018   Procedure: EXTRACORPOREAL SHOCK WAVE LITHOTRIPSY (ESWL);  Surgeon: Riki Altes, MD;  Location: [facility];  Service: Urology;  Laterality: Right;   EYE SURGERY     LEEP     WISDOM TOOTH EXTRACTION       Current Outpatient Medications:    amphetamine-dextroamphetamine (ADDERALL XR) 20 MG 24  hr capsule, Take 20 mg by mouth every morning., Disp: , Rfl:    Cranberry 125 MG TABS, Take 2 tablets by mouth daily., Disp: , Rfl:    metroNIDAZOLE (FLAGYL) 500 MG tablet, Take 1 tablet (500 mg total) by mouth 2 (two) times daily for 7 days., Disp: 14 tablet, Rfl: 0   Probiotic Product (PROBIOTIC-10 PO), Take by mouth., Disp: , Rfl:   Allergies  Allergen Reactions   Sulfa Antibiotics Hives   ROS neg/noncontributory except as noted HPI/below      Objective:     BP 113/80 (BP Location: Left Arm, Patient Position: Sitting)   Pulse 92   Temp 98.3 F (36.8 C) (Temporal)   Ht 5\' 7"  (1.702 m)   Wt 148 lb 12.8 oz (67.5 kg)   SpO2 99%   BMI 23.31 kg/m  Wt Readings from Last 3 Encounters:  01/22/23 148 lb 12.8 oz (67.5 kg)  11/06/22 153 lb (69.4 kg)  08/02/22 155 lb 4 oz (70.4 kg)    Physical Exam   Gen: WDWN NAD HEENT: NCAT, conjunctiva not injected, sclera nonicteric EXT:  no edema MSK: no gross abnormalities.  NEURO: A&O x3.  CN II-XII intact.  PSYCH: normal mood. Good eye contact   Genitourinary              Inguinal/mons:  Normal without inguinal adenopathy  External genitalia:  Normal appearing vulva with no masses, tenderness, or lesions but some redness             BUS/Urethra/Skene's glands:  Normal             Vagina:  Normal appearing with normal color and discharge, no lesions             Cervix:  Slight white, creamy discharge. No lesions. Slight fishy odor             Uterus:  Normal in size, shape and contour.  Midline and mobile, nontender             Adnexa/parametria:                           Rt:        Normal in size, without masses or tenderness.                         Lt:        Normal in size, without masses or tenderness.             Anus and perineum: Normal  Chaperone present TL   Results for orders placed or performed in visit on 01/22/23  POCT urine pregnancy  Result Value Ref Range   Preg Test, Ur Negative Negative  POCT  Urinalysis Dipstick (Automated)  Result Value Ref Range   Color, UA yellow    Clarity, UA clear    Glucose, UA Negative Negative   Bilirubin, UA Negative    Ketones, UA Negative    Spec Grav, UA 1.020 1.010 - 1.025   Blood, UA Negative    pH, UA 6.0 5.0 - 8.0   Protein, UA Negative Negative   Urobilinogen, UA 0.2 0.2 or 1.0 E.U./dL   Nitrite, UA Negative    Leukocytes, UA Negative Negative        Assessment & Plan:  Unprotected sex -     Cervicovaginal ancillary only -     POCT urine pregnancy -     POCT Urinalysis Dipstick (Automated)  Vaginal discharge -     Cervicovaginal ancillary only -     POCT urine pregnancy -     POCT Urinalysis Dipstick (Automated)  Other orders -     metroNIDAZOLE; Take 1 tablet (500 mg total) by mouth 2 (two) times daily for 7 days.  Dispense: 14 tablet; Refill: 0   Vaginal d/c.  Self tx w/diflucan yesterday so yeast may be neg.  ?BV-recurrent.  Start to track if ater sees partner.  Will check for STI's.  Discussed using condoms.  Rx for flagyl 500mg  bid to hold.  Educated pt on STI's. Unprotected intercourse-discussed contraception-intol hormonal.  Use condoms.    Return if symptoms worsen or fail to improve.  I,Rachel Rivera,acting as a scribe for Angelena Sole, MD.,have documented all relevant documentation on the behalf of Angelena Sole, MD,as directed by  Angelena Sole, MD while in the presence of Angelena Sole, MD.  I, Angelena Sole, MD, have reviewed all documentation for this visit. The documentation on 01/22/23 for the exam, diagnosis, procedures, and orders are all accurate and complete.   Angelena Sole, MD

## 2023-01-23 LAB — CERVICOVAGINAL ANCILLARY ONLY
Bacterial Vaginitis (gardnerella): POSITIVE — AB
Candida Glabrata: NEGATIVE
Candida Vaginitis: POSITIVE — AB
Chlamydia: NEGATIVE
Comment: NEGATIVE
Comment: NEGATIVE
Comment: NEGATIVE
Comment: NEGATIVE
Comment: NEGATIVE
Comment: NORMAL
Neisseria Gonorrhea: NEGATIVE
Trichomonas: NEGATIVE

## 2023-01-23 NOTE — Progress Notes (Signed)
Yeast and BV-so definitely take the metronidazole.  She already took diflucan.  Let me know if not improving

## 2023-07-04 ENCOUNTER — Encounter (INDEPENDENT_AMBULATORY_CARE_PROVIDER_SITE_OTHER): Payer: Self-pay

## 2023-07-16 ENCOUNTER — Ambulatory Visit: Payer: 59 | Admitting: Family

## 2023-07-16 ENCOUNTER — Other Ambulatory Visit (HOSPITAL_COMMUNITY)
Admission: RE | Admit: 2023-07-16 | Discharge: 2023-07-16 | Disposition: A | Discharge status: Home / Self Care | Payer: 59 | Source: Ambulatory Visit | Attending: Family | Admitting: Family

## 2023-07-16 ENCOUNTER — Encounter: Payer: Self-pay | Admitting: Family

## 2023-07-16 VITALS — BP 121/77 | HR 101 | Temp 97.8°F | Ht 67.0 in | Wt 144.2 lb

## 2023-07-16 DIAGNOSIS — R2231 Localized swelling, mass and lump, right upper limb: Secondary | ICD-10-CM

## 2023-07-16 DIAGNOSIS — Z113 Encounter for screening for infections with a predominantly sexual mode of transmission: Secondary | ICD-10-CM | POA: Diagnosis not present

## 2023-07-16 NOTE — Progress Notes (Signed)
 Patient ID: Jennifer [name], female    DOB: 1996/02/22, 28 y.o.   MRN: [id number]  Chief Complaint  Patient presents with   bump in rt hands   std screening       Discussed the use of AI scribe software for clinical note transcription with the patient, who gave verbal consent to proceed.  History of Present Illness   Jennifer [name] is a 28 year old female who presents for STD screening and evaluation of a hand nodule. She is here for STD screening with no symptoms related to sexually transmitted diseases. Her hepatitis screening is past due, and it has been a while since her last full panel. She agrees to include a urine test for chlamydia, gonorrhea, and trichomoniasis. She has a nodule on her hand that appeared a few weeks ago. Initially, it felt like a small bead and has become less prominent over time. It is sometimes tender, especially when pressing on it while using a soap dispenser at work, but it does not affect finger movement. She wonders if it could be related to her workouts, as she holds weights, which puts pressure on her hands.     Assessment & Plan:     Hand Nodule - Small, non-tender nodule on hand, not affecting movement. Possible benign nodule or early Dupuytren's contracture, though patient is young for the latter. No current impact on function. - Monitor for changes in size, pain, or impact on hand function.  STD Screening - No symptoms, but patient requests routine screening. -Order full STD panel including HIV, RPR, Hepatitis, Chlamydia, Gonorrhea, and Trichomonas. -Provide urine sample for testing. -Results will be reviewed on MyChart in a few days.     Subjective:    Outpatient Medications Prior to Visit  Medication Sig Dispense Refill   amphetamine-dextroamphetamine (ADDERALL XR) 20 MG 24 hr capsule Take 20 mg by mouth every morning.     Cranberry 125 MG TABS Take 2 tablets by mouth daily.     Probiotic Product (PROBIOTIC-10 PO) Take by mouth.      No facility-administered medications prior to visit.   Past Medical History:  Diagnosis Date   History of recurrent UTIs    Irregular periods    started irregular as a teenager; then went on OCPs, stopped in 2019, has been regular since then   Kidney stone on right side 12/24/2017   Strabismus    Past Surgical History:  Procedure Laterality Date   EXTRACORPOREAL SHOCK WAVE LITHOTRIPSY Right 01/03/2018   Procedure: EXTRACORPOREAL SHOCK WAVE LITHOTRIPSY (ESWL);  Surgeon: Geraline Knapp, MD;  Location: [facility] OR[facility];  Service: Urology;  Laterality: Right;   EYE SURGERY     LEEP     WISDOM TOOTH EXTRACTION     Allergies  Allergen Reactions   Sulfa Antibiotics Hives      Objective:    Physical Exam Vitals and nursing note reviewed.  Constitutional:      Appearance: Normal appearance.  Cardiovascular:     Rate and Rhythm: Normal rate and regular rhythm.  Pulmonary:     Effort: Pulmonary effort is normal.     Breath sounds: Normal breath sounds.  Musculoskeletal:        General: Normal range of motion.     Right hand: No swelling, tenderness or bony tenderness. Normal range of motion. Normal sensation.  Skin:    General: Skin is warm and dry.  Neurological:     Mental Status: She is alert.  Psychiatric:  Mood and Affect: Mood normal.        Behavior: Behavior normal.    BP 121/77   Pulse (!) 101   Temp 97.8 F (36.6 C)   Ht 5\' 7"  (1.702 m)   Wt 144 lb 3.2 oz (65.4 kg)   SpO2 100%   BMI 22.58 kg/m  Wt Readings from Last 3 Encounters:  07/16/23 144 lb 3.2 oz (65.4 kg)  01/22/23 148 lb 12.8 oz (67.5 kg)  11/06/22 153 lb (69.4 kg)      Versa Gore, NP

## 2023-07-17 LAB — URINE CYTOLOGY ANCILLARY ONLY
Chlamydia: NEGATIVE
Comment: NEGATIVE
Comment: NEGATIVE
Comment: NORMAL
Neisseria Gonorrhea: NEGATIVE
Trichomonas: NEGATIVE

## 2023-07-17 LAB — HEPATITIS C ANTIBODY: Hepatitis C Ab: NONREACTIVE

## 2023-07-17 LAB — RPR: RPR Ser Ql: NONREACTIVE

## 2023-07-17 LAB — HIV ANTIBODY (ROUTINE TESTING W REFLEX): HIV 1&2 Ab, 4th Generation: NONREACTIVE

## 2023-07-18 ENCOUNTER — Encounter: Payer: Self-pay | Admitting: Family

## 2023-09-25 ENCOUNTER — Ambulatory Visit (INDEPENDENT_AMBULATORY_CARE_PROVIDER_SITE_OTHER): Payer: 59 | Admitting: Physician Assistant

## 2023-09-25 ENCOUNTER — Encounter: Payer: Self-pay | Admitting: Physician Assistant

## 2023-09-25 VITALS — BP 108/76 | HR 92 | Temp 97.7°F | Ht 67.0 in | Wt 144.8 lb

## 2023-09-25 DIAGNOSIS — Z0000 Encounter for general adult medical examination without abnormal findings: Secondary | ICD-10-CM

## 2023-09-25 NOTE — Progress Notes (Signed)
 Patient ID: Jennifer [name], female    DOB: 1996-04-30, 28 y.o.   MRN: [id number]   Assessment & Plan:  Annual physical exam -     CBC with Differential/Platelet; Future -     Comprehensive metabolic panel with GFR; Future -     Lipid panel; Future   Assessment & Plan Wellness Visit Routine wellness visit with no acute concerns. Blood pressure and weight are well-managed. Labs from 2023 were normal. Discussed future cholesterol monitoring. Vision and dental are current. - Schedule fasting lab appointment for cholesterol monitoring. - Follow up in 1-2 years for routine physical unless new concerns arise.  Patient Counseling: [x]   Nutrition: Stressed importance of moderation in sodium/caffeine intake, saturated fat and cholesterol, caloric balance, sufficient intake of fresh fruits, vegetables, and fiber.  [x]   Stressed the importance of regular exercise.   [x]   Substance Abuse: Discussed cessation/primary prevention of tobacco, alcohol, or other drug use; driving or other dangerous activities under the influence; availability of treatment for abuse.   []   Injury prevention: Discussed safety belts, safety helmets, smoke detector, smoking near bedding or upholstery.   [x]   Sexuality: Discussed sexually transmitted diseases, partner selection, use of condoms, avoidance of unintended pregnancy  and contraceptive alternatives.   [x]   Dental health: Discussed importance of regular tooth brushing, flossing, and dental visits.  [x]   Health maintenance and immunizations reviewed. Please refer to Health maintenance section.         No follow-ups on file.    Subjective:    Chief Complaint  Patient presents with   Annual Exam    Pt in office for annual CPE; pt is not fasting for labs;     HPI Discussed the use of AI scribe software for clinical note transcription with the patient, who gave verbal consent to proceed.  History of Present Illness Jennifer [name] is a 28  year old female who presents for an annual physical exam.  She is up to date with her gynecological care, including a normal Pap smear last year. She previously underwent a procedure for abnormal cells with no recurrence.  In 2023, she was referred to dermatology and attended the appointment. There have been no new skin changes, and previously removed moles have healed well. Her grandfather had skin cancer on his neck, which was removed.  She experiences stress and manages it independently, having considered counseling but not pursued it. She lives alone, is physically active, previously lifting weights at [facility], and tries to maintain a healthy diet.  She is sexually active with one person intermittently and recently took a Plan B pill. She has used various contraceptives but avoids synthetic hormones due to past side effects. No new sexual partners, UTIs, or kidney infections recently.  She has a history of recurrent bacterial vaginosis, which has improved with no current issues.  She smokes marijuana daily in small quantities using a glass pipe and does not use tobacco.      Past Medical History:  Diagnosis Date   History of recurrent UTIs    Irregular periods    started irregular as a teenager; then went on OCPs, stopped in 2019, has been regular since then   Kidney stone on right side 12/24/2017   Strabismus     Past Surgical History:  Procedure Laterality Date   EXTRACORPOREAL SHOCK WAVE LITHOTRIPSY Right 01/03/2018   Procedure: EXTRACORPOREAL SHOCK WAVE LITHOTRIPSY (ESWL);  Surgeon: Geraline Knapp, MD;  Location: [facility] ORS;  Service:  Urology;  Laterality: Right;   EYE SURGERY     LEEP     WISDOM TOOTH EXTRACTION      Family History  Problem Relation Age of Onset   Alcohol abuse Mother    Kidney failure Father    Skin cancer Father    Dementia Maternal Grandmother    Skin cancer Paternal Grandfather    Heart Problems Paternal Grandfather     Social History    Tobacco Use   Smoking status: Former    Current packs/day: 0.00    Types: Cigarettes    Start date: 03/19/2012    Quit date: 03/20/2015    Years since quitting: 8.5   Smokeless tobacco: Never  Vaping Use   Vaping status: Never Used  Substance Use Topics   Alcohol use: Yes    Alcohol/week: 0.0 standard drinks of alcohol    Comment: occ   Drug use: No     Allergies  Allergen Reactions   Sulfa Antibiotics Hives    Review of Systems NEGATIVE UNLESS OTHERWISE INDICATED IN HPI      Objective:     BP 108/76 (BP Location: Left Arm, Patient Position: Sitting, Cuff Size: Normal)   Pulse 92   Temp 97.7 F (36.5 C) (Temporal)   Ht 5\' 7"  (1.702 m)   Wt 144 lb 12.8 oz (65.7 kg)   LMP 08/28/2023 (Exact Date)   SpO2 97%   BMI 22.68 kg/m   Wt Readings from Last 3 Encounters:  09/25/23 144 lb 12.8 oz (65.7 kg)  07/16/23 144 lb 3.2 oz (65.4 kg)  01/22/23 148 lb 12.8 oz (67.5 kg)    BP Readings from Last 3 Encounters:  09/25/23 108/76  07/16/23 121/77  01/22/23 113/80     Physical Exam Vitals and nursing note reviewed.  Constitutional:      Appearance: Normal appearance. She is normal weight. She is not toxic-appearing.  HENT:     Head: Normocephalic and atraumatic.     Right Ear: Tympanic membrane, ear canal and external ear normal.     Left Ear: Tympanic membrane, ear canal and external ear normal.     Nose: Nose normal.     Mouth/Throat:     Mouth: Mucous membranes are moist.  Eyes:     Extraocular Movements: Extraocular movements intact.     Conjunctiva/sclera: Conjunctivae normal.     Pupils: Pupils are equal, round, and reactive to light.  Cardiovascular:     Rate and Rhythm: Normal rate and regular rhythm.     Pulses: Normal pulses.     Heart sounds: Normal heart sounds.  Pulmonary:     Effort: Pulmonary effort is normal.     Breath sounds: Normal breath sounds.  Abdominal:     General: Abdomen is flat. Bowel sounds are normal.     Palpations:  Abdomen is soft.  Musculoskeletal:        General: Normal range of motion.     Cervical back: Normal range of motion and neck supple.  Skin:    General: Skin is warm and dry.  Neurological:     General: No focal deficit present.     Mental Status: She is alert and oriented to person, place, and time.  Psychiatric:        Mood and Affect: Mood normal.        Behavior: Behavior normal.        Thought Content: Thought content normal.        Judgment: Judgment  normal.             Avonell Lenig M Colbi Schiltz, PA-C

## 2023-10-01 ENCOUNTER — Other Ambulatory Visit (INDEPENDENT_AMBULATORY_CARE_PROVIDER_SITE_OTHER)

## 2023-10-01 DIAGNOSIS — Z13220 Encounter for screening for lipoid disorders: Secondary | ICD-10-CM | POA: Diagnosis not present

## 2023-10-01 DIAGNOSIS — Z0000 Encounter for general adult medical examination without abnormal findings: Secondary | ICD-10-CM | POA: Diagnosis not present

## 2023-10-01 LAB — CBC WITH DIFFERENTIAL/PLATELET
Basophils Absolute: 0.1 10*3/uL (ref 0.0–0.1)
Basophils Relative: 1.1 % (ref 0.0–3.0)
Eosinophils Absolute: 0.2 10*3/uL (ref 0.0–0.7)
Eosinophils Relative: 3.1 % (ref 0.0–5.0)
HCT: 41.9 % (ref 36.0–46.0)
Hemoglobin: 14.2 g/dL (ref 12.0–15.0)
Lymphocytes Relative: 38.8 % (ref 12.0–46.0)
Lymphs Abs: 2.4 10*3/uL (ref 0.7–4.0)
MCHC: 33.9 g/dL (ref 30.0–36.0)
MCV: 92.2 fl (ref 78.0–100.0)
Monocytes Absolute: 0.5 10*3/uL (ref 0.1–1.0)
Monocytes Relative: 8.7 % (ref 3.0–12.0)
Neutro Abs: 3 10*3/uL (ref 1.4–7.7)
Neutrophils Relative %: 48.3 % (ref 43.0–77.0)
Platelets: 277 10*3/uL (ref 150.0–400.0)
RBC: 4.54 Mil/uL (ref 3.87–5.11)
RDW: 13.3 % (ref 11.5–15.5)
WBC: 6.3 10*3/uL (ref 4.0–10.5)

## 2023-10-01 LAB — COMPREHENSIVE METABOLIC PANEL WITH GFR
ALT: 10 U/L (ref 0–35)
AST: 12 U/L (ref 0–37)
Albumin: 4.4 g/dL (ref 3.5–5.2)
Alkaline Phosphatase: 44 U/L (ref 39–117)
BUN: 12 mg/dL (ref 6–23)
CO2: 27 meq/L (ref 19–32)
Calcium: 9.1 mg/dL (ref 8.4–10.5)
Chloride: 104 meq/L (ref 96–112)
Creatinine, Ser: 0.7 mg/dL (ref 0.40–1.20)
GFR: 117.07 mL/min (ref >60.00)
Glucose, Bld: 78 mg/dL (ref 70–99)
Potassium: 3.8 meq/L (ref 3.5–5.1)
Sodium: 138 meq/L (ref 135–145)
Total Bilirubin: 0.4 mg/dL (ref 0.2–1.2)
Total Protein: 7 g/dL (ref 6.0–8.3)

## 2023-10-01 LAB — LIPID PANEL
Cholesterol: 151 mg/dL (ref 0–200)
HDL: 56 mg/dL (ref >39.00)
LDL Cholesterol: 87 mg/dL (ref 0–99)
NonHDL: 94.71
Total CHOL/HDL Ratio: 3
Triglycerides: 37 mg/dL (ref 0.0–149.0)
VLDL: 7.4 mg/dL (ref 0.0–40.0)

## 2023-10-02 ENCOUNTER — Encounter: Payer: Self-pay | Admitting: Physician Assistant

## 2023-11-05 ENCOUNTER — Ambulatory Visit: Admitting: Physician Assistant

## 2023-11-12 ENCOUNTER — Ambulatory Visit: Admitting: Physician Assistant

## 2023-11-19 ENCOUNTER — Ambulatory Visit: Admitting: Physician Assistant

## 2024-02-20 ENCOUNTER — Other Ambulatory Visit (HOSPITAL_COMMUNITY)
Admission: RE | Admit: 2024-02-20 | Discharge: 2024-02-20 | Disposition: A | Discharge status: Home / Self Care | Source: Ambulatory Visit | Attending: Physician Assistant | Admitting: Physician Assistant

## 2024-02-20 ENCOUNTER — Ambulatory Visit: Admitting: Physician Assistant

## 2024-02-20 ENCOUNTER — Encounter: Payer: Self-pay | Admitting: Physician Assistant

## 2024-02-20 VITALS — BP 110/70 | HR 85 | Temp 98.1°F | Ht 67.0 in | Wt 148.2 lb

## 2024-02-20 DIAGNOSIS — R5383 Other fatigue: Secondary | ICD-10-CM

## 2024-02-20 DIAGNOSIS — Z113 Encounter for screening for infections with a predominantly sexual mode of transmission: Secondary | ICD-10-CM

## 2024-02-20 DIAGNOSIS — N898 Other specified noninflammatory disorders of vagina: Secondary | ICD-10-CM

## 2024-02-20 LAB — IBC + FERRITIN
Ferritin: 58.6 ng/mL (ref 10.0–291.0)
Iron: 84 ug/dL (ref 42–145)
Saturation Ratios: 26.7 % (ref 20.0–50.0)
TIBC: 315 ug/dL (ref 250.0–450.0)
Transferrin: 225 mg/dL (ref 212.0–360.0)

## 2024-02-20 LAB — VITAMIN B12: Vitamin B-12: 619 pg/mL (ref 211–911)

## 2024-02-20 LAB — VITAMIN D 25 HYDROXY (VIT D DEFICIENCY, FRACTURES): VITD: 32.31 ng/mL (ref 30.00–100.00)

## 2024-02-20 NOTE — Progress Notes (Signed)
 Jennifer [name] is a 28 y.o. female here for a follow up of a pre-existing problem.  History of Present Illness:   Chief Complaint  Patient presents with   Vaginal Discharge    Pt c/o white vaginal discharge x 4 days, taking Plan B pill August 20 th.     Discussed the use of AI scribe software for clinical note transcription with the patient, who gave verbal consent to proceed.  History of Present Illness Jennifer [name] is a 28 year old female who presents with concerns following a broken condom and Plan B use.  She took Plan B after a condom broke during intercourse and describes her body as sensitive to it, having experienced similar reactions previously. She is concerned about potential bacterial vaginosis due to her history, although she has not had frequent episodes in the past year. She is experiencing discharge but no other significant symptoms. Her history of UTIs includes burning with urination, frequent urination, and incomplete bladder emptying, but these are not present currently.  She is interested in checking her iron, vitamin D , and B12 levels, as she has not taken iron supplements before and is concerned about potential deficiencies.    Past Medical History:  Diagnosis Date   History of recurrent UTIs    Irregular periods    started irregular as a teenager; then went on OCPs, stopped in 2019, has been regular since then   Kidney stone on right side 12/24/2017   Strabismus      Social History   Tobacco Use   Smoking status: Former    Current packs/day: 0.00    Types: Cigarettes    Start date: 03/19/2012    Quit date: 03/20/2015    Years since quitting: 8.9   Smokeless tobacco: Never  Vaping Use   Vaping status: Never Used  Substance Use Topics   Alcohol use: Yes    Alcohol/week: 0.0 standard drinks of alcohol    Comment: occ   Drug use: No    Past Surgical History:  Procedure Laterality Date   EXTRACORPOREAL SHOCK WAVE LITHOTRIPSY Right  01/03/2018   Procedure: EXTRACORPOREAL SHOCK WAVE LITHOTRIPSY (ESWL);  Surgeon: Twylla Glendia BROCKS, MD;  Location: [facility] ORS;  Service: Urology;  Laterality: Right;   EYE SURGERY     LEEP     WISDOM TOOTH EXTRACTION      Family History  Problem Relation Age of Onset   Alcohol abuse Mother    Kidney failure Father    Skin cancer Father    Dementia Maternal Grandmother    Skin cancer Paternal Grandfather    Heart Problems Paternal Grandfather     Allergies  Allergen Reactions   Sulfa Antibiotics Hives    Current Medications:   Current Outpatient Medications:    amphetamine-dextroamphetamine (ADDERALL XR) 25 MG 24 hr capsule, Take by mouth every morning., Disp: , Rfl:    Cranberry 125 MG TABS, Take 2 tablets by mouth daily., Disp: , Rfl:    Probiotic Product (PROBIOTIC 10 ULTRA STRENGTH PO), , Disp: , Rfl:    Review of Systems:   Negative unless otherwise specified per HPI.  Vitals:   Vitals:   02/20/24 1150  BP: 110/70  Pulse: 85  Temp: 98.1 F (36.7 C)  TempSrc: Temporal  SpO2: 99%  Weight: 148 lb 4 oz (67.2 kg)  Height: 5' 7 (1.702 m)     Body mass index is 23.22 kg/m.  Physical Exam:   Physical Exam Vitals and nursing note  reviewed.  Constitutional:      General: She is not in acute distress.    Appearance: She is well-developed. She is not ill-appearing or toxic-appearing.  Cardiovascular:     Rate and Rhythm: Normal rate and regular rhythm.     Pulses: Normal pulses.     Heart sounds: Normal heart sounds, S1 normal and S2 normal.  Pulmonary:     Effort: Pulmonary effort is normal.     Breath sounds: Normal breath sounds.  Abdominal:     Tenderness: There is no right CVA tenderness or left CVA tenderness.  Skin:    General: Skin is warm and dry.  Neurological:     Mental Status: She is alert.     GCS: GCS eye subscore is 4. GCS verbal subscore is 5. GCS motor subscore is 6.  Psychiatric:        Speech: Speech normal.        Behavior: Behavior  normal. Behavior is cooperative.     Assessment and Plan:   Assessment and Plan Assessment & Plan Vaginal discharge Recent discharge post-condom failure and Plan B. Differential includes BV and yeast infection. Abdominal cramping and spotting likely due to Plan B. - Self-swab for gonorrhea, chlamydia, trichomonas, BV, and yeast. - Blood tests for HIV and syphilis. - Continue cranberry pills. - reports there is no urinary symptom(s) so we opted to not complete urinalysis today  Routine sexual transmitted infection screening Will add sexual transmitted infection testing per patient request  Fatigue She is requesting blood work to check vitamin levels -- will add B12, vitamin D  and iron panel today Will provide recommendations based on results     Lucie Buttner, PA-C

## 2024-02-21 ENCOUNTER — Ambulatory Visit: Payer: Self-pay | Admitting: Physician Assistant

## 2024-02-21 LAB — RPR: RPR Ser Ql: NONREACTIVE

## 2024-02-21 LAB — CERVICOVAGINAL ANCILLARY ONLY
Bacterial Vaginitis (gardnerella): POSITIVE — AB
Candida Glabrata: NEGATIVE
Candida Vaginitis: POSITIVE — AB
Chlamydia: NEGATIVE
Comment: NEGATIVE
Comment: NEGATIVE
Comment: NEGATIVE
Comment: NEGATIVE
Comment: NEGATIVE
Comment: NORMAL
Neisseria Gonorrhea: NEGATIVE
Trichomonas: NEGATIVE

## 2024-02-21 LAB — HIV ANTIBODY (ROUTINE TESTING W REFLEX)
HIV 1&2 Ab, 4th Generation: NONREACTIVE
HIV FINAL INTERPRETATION: NEGATIVE

## 2024-02-21 MED ORDER — FLUCONAZOLE 150 MG PO TABS: ORAL_TABLET | ORAL | 0 refills | Status: DC

## 2024-02-21 MED ORDER — METRONIDAZOLE 500 MG PO TABS: 500.0000 mg | ORAL_TABLET | Freq: Two times a day (BID) | ORAL | 0 refills | Status: AC

## 2024-04-25 IMAGING — US US BREAST*L* LIMITED INC AXILLA
1 series · 5 of 5 positions shown · non-contrast
Comparison: None Available.
COMPARISON: June 20, 2018 ultrasound.

*** End of Addendum ***
COMPARISON: None Available.

Addendum:
CLINICAL DATA: Left breast erythema and pain. Recent nipple
piercing.

EXAM:
ULTRASOUND OF THE LEFT BREAST

[Series 1: us breast*left* limited inc axilla · 0.07mm/px · 5 of 5 slices shown]
[im 1/5]
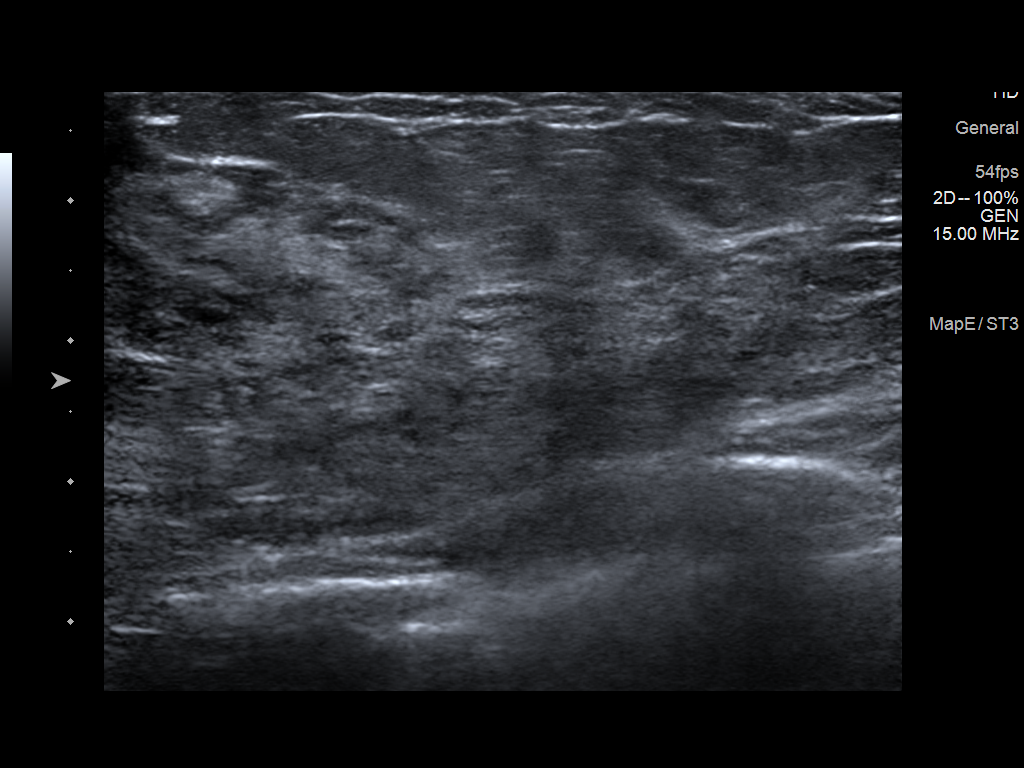
[im 2/5]
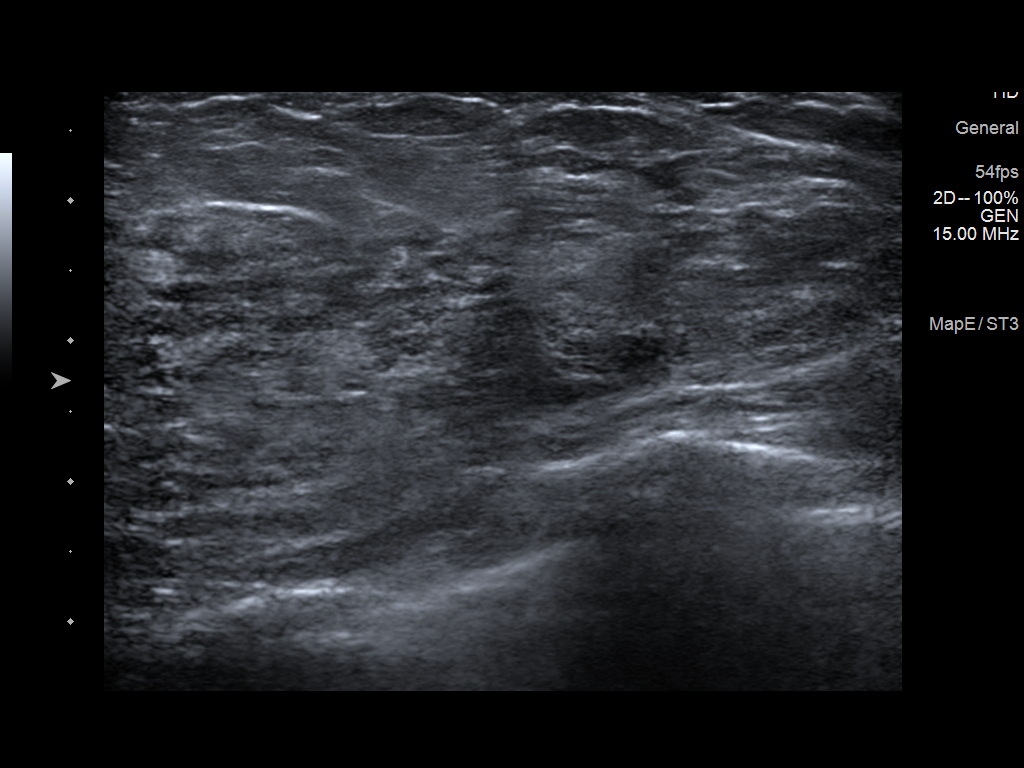
[im 3/5]
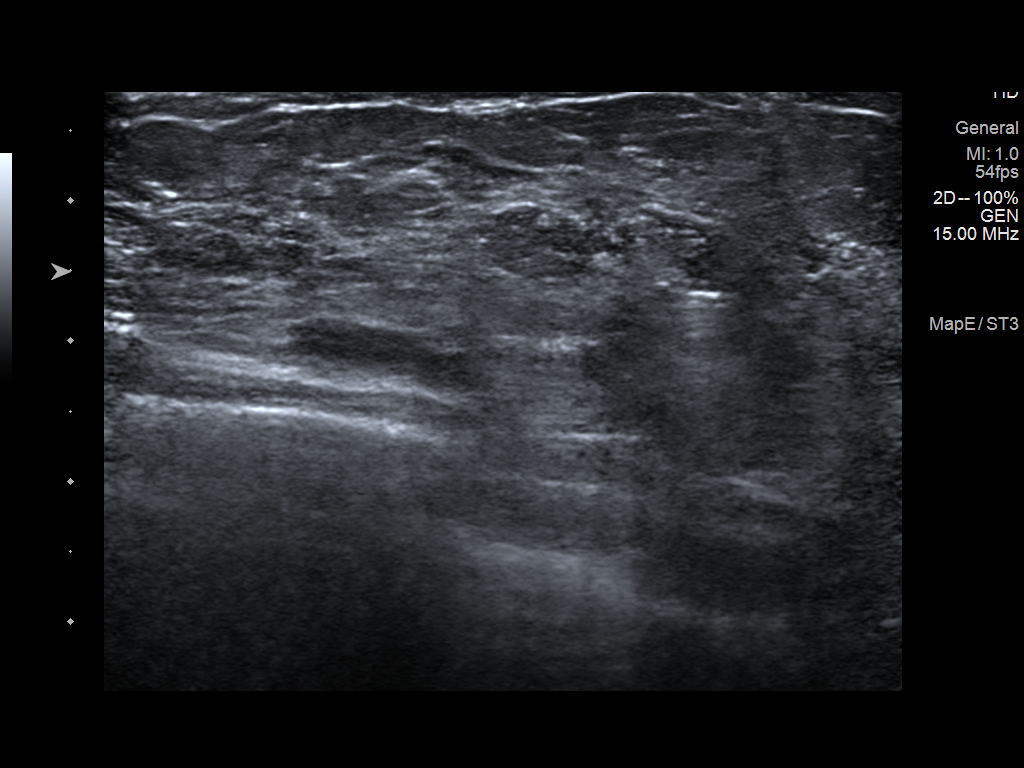
[im 4/5]
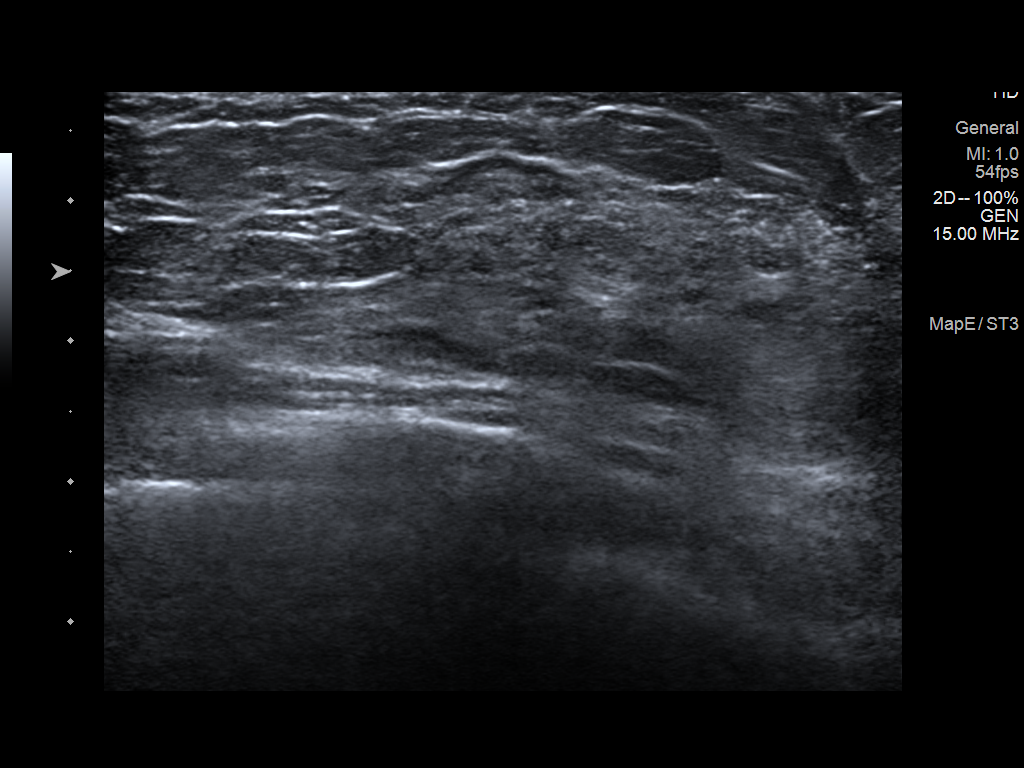
[im 5/5]
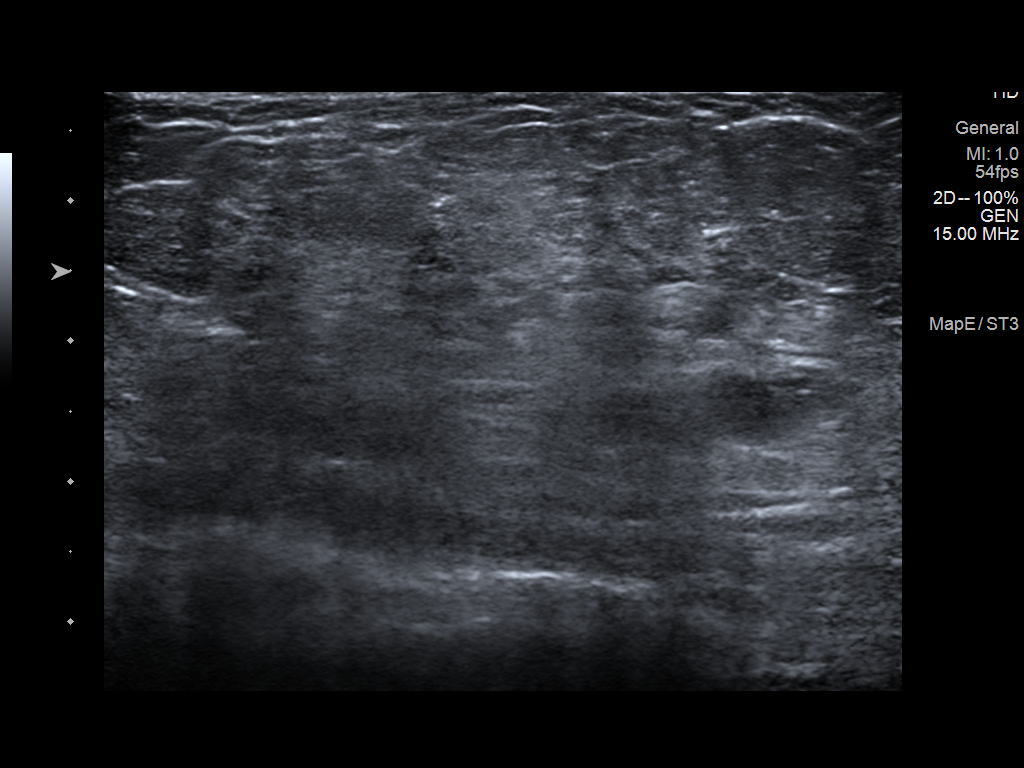

[5 of 5 positions shown; findings below may reference images not displayed]

FINDINGS: On physical exam, there is erythema in the 4 o'clock region of the
left breast.

Targeted ultrasound is performed, showing no evidence of soft tissue
edema, mass, or abscess.
IMPRESSION: No evidence of malignancy. The skin erythema is likely due to
mastitis.

RECOMMENDATION:
The patient was given a 10 day course of doxycycline. She was
instructed to return if her symptoms worsen on doxycycline or if her
symptoms do not completely resolve by the end of her antibiotic
course.

I have discussed the findings and recommendations with the patient.
If applicable, a reminder letter will be sent to the patient
regarding the next appointment.

BI-RADS CATEGORY  2: Benign.
FINDINGS: On physical exam, there is erythema in the 4 o'clock region of the
left breast.

Targeted ultrasound is performed, showing no evidence of soft tissue
edema, mass, or abscess.
IMPRESSION: No evidence of malignancy. The skin erythema is likely due to
mastitis.

RECOMMENDATION:
The patient was given a 10 day course of doxycycline. She was
instructed to return if her symptoms worsen on doxycycline or if her
symptoms do not completely resolve by the end of her antibiotic
course.

I have discussed the findings and recommendations with the patient.
If applicable, a reminder letter will be sent to the patient
regarding the next appointment.

BI-RADS CATEGORY  2: Benign.

## 2024-06-11 ENCOUNTER — Ambulatory Visit: Admitting: Family

## 2024-06-11 ENCOUNTER — Encounter: Payer: Self-pay | Admitting: Family

## 2024-06-11 ENCOUNTER — Other Ambulatory Visit (HOSPITAL_COMMUNITY)
Admission: RE | Admit: 2024-06-11 | Discharge: 2024-06-11 | Disposition: A | Discharge status: Home / Self Care | Source: Ambulatory Visit | Attending: Family | Admitting: Family

## 2024-06-11 VITALS — BP 112/72 | HR 80 | Temp 97.5°F | Ht 67.0 in | Wt 146.4 lb

## 2024-06-11 DIAGNOSIS — N898 Other specified noninflammatory disorders of vagina: Secondary | ICD-10-CM | POA: Insufficient documentation

## 2024-06-11 NOTE — Progress Notes (Signed)
 "  Patient ID: Jennifer [name], female    DOB: [birth year], 29 y.o.   MRN: [id number]  Chief Complaint  Patient presents with   Vaginal Discharge    Pt c/o vaginal white-yellow discharge, Present for 1 week. Patient states hx of BV.  Discussed the use of AI scribe software for clinical note transcription with the patient, who gave verbal consent to proceed.  History of Present Illness Jennifer [name] is a 29 year old female who presents with vaginal discharge and concerns about bacterial vaginosis.  She reports white to yellow vaginal discharge of variable amount without foul odor and is concerned about BV. She notes a metallic blood-like smell only in the last few days of her menstrual cycle. Denies foul odor, no thick white d/c or itching. She has had BV and yeast infections before, including a combined episode a few months ago that was treated, though symptoms worsened when her menstrual period started during treatment. She wants to be checked to ensure she is clear of vaginal infections, including STDs.  Assessment & Plan Vaginitis Intermittent white-yellow discharge suggests BV. STD concern noted. - Performed vaginal swab for BV and STD testing. - Results will be reviewed via MyChart in 2-3 days. - Will prescribe antifungal medication to prevent yeast infection if antibiotic used. - Follow up in office if sx persist.  Subjective:    Outpatient Medications Prior to Visit  Medication Sig Dispense Refill   amphetamine-dextroamphetamine (ADDERALL XR) 25 MG 24 hr capsule Take by mouth every morning.     amphetamine-dextroamphetamine (ADDERALL XR) 30 MG 24 hr capsule Take 30 mg by mouth daily.     Cranberry 125 MG TABS Take 2 tablets by mouth daily.     fluconazole  (DIFLUCAN ) 150 MG tablet Take 1 tablet by mouth. If symptoms persist, may take an additional tablet in 3-5 days. May repeat for a total of 3 tablets. 3 tablet 0   Probiotic Product (PROBIOTIC 10 ULTRA STRENGTH PO)       No facility-administered medications prior to visit.   Past Medical History:  Diagnosis Date   History of recurrent UTIs    Irregular periods    started irregular as a teenager; then went on OCPs, stopped in 2019, has been regular since then   Kidney stone on right side 12/24/2017   Strabismus    Past Surgical History:  Procedure Laterality Date   EXTRACORPOREAL SHOCK WAVE LITHOTRIPSY Right 01/03/2018   Procedure: EXTRACORPOREAL SHOCK WAVE LITHOTRIPSY (ESWL);  Surgeon: Twylla Glendia BROCKS, MD;  Location: [facility] ORS;  Service: Urology;  Laterality: Right;   EYE SURGERY     LEEP     WISDOM TOOTH EXTRACTION     Allergies[1]    Objective:    Physical Exam Vitals and nursing note reviewed.  Constitutional:      Appearance: Normal appearance.  Cardiovascular:     Rate and Rhythm: Normal rate and regular rhythm.  Pulmonary:     Effort: Pulmonary effort is normal.     Breath sounds: Normal breath sounds.  Musculoskeletal:        General: Normal range of motion.  Skin:    General: Skin is warm and dry.  Neurological:     Mental Status: She is alert.  Psychiatric:        Mood and Affect: Mood normal.        Behavior: Behavior normal.    BP 112/72 (BP Location: Left Arm, Patient Position: Sitting, Cuff Size: Normal)   Pulse  80   Temp (!) 97.5 F (36.4 C) (Temporal)   Ht 5' 7 (1.702 m)   Wt 146 lb 6.4 oz (66.4 kg)   LMP 06/02/2024   SpO2 98%   BMI 22.93 kg/m  Wt Readings from Last 3 Encounters:  06/11/24 146 lb 6.4 oz (66.4 kg)  02/20/24 148 lb 4 oz (67.2 kg)  09/25/23 144 lb 12.8 oz (65.7 kg)      Jennifer Ludington, NP     [1]  Allergies Allergen Reactions   Sulfa Antibiotics Hives   "

## 2024-06-12 ENCOUNTER — Ambulatory Visit: Payer: Self-pay | Admitting: Family

## 2024-06-12 DIAGNOSIS — B9689 Other specified bacterial agents as the cause of diseases classified elsewhere: Secondary | ICD-10-CM

## 2024-06-12 LAB — CERVICOVAGINAL ANCILLARY ONLY
Bacterial Vaginitis (gardnerella): POSITIVE — AB
Candida Glabrata: NEGATIVE
Candida Vaginitis: NEGATIVE
Chlamydia: NEGATIVE
Comment: NEGATIVE
Comment: NEGATIVE
Comment: NEGATIVE
Comment: NEGATIVE
Comment: NEGATIVE
Comment: NORMAL
Neisseria Gonorrhea: NEGATIVE
Trichomonas: NEGATIVE

## 2024-06-12 MED ORDER — METRONIDAZOLE 500 MG PO TABS: 500.0000 mg | ORAL_TABLET | Freq: Two times a day (BID) | ORAL | 0 refills | Status: DC

## 2024-06-12 MED ORDER — FLUCONAZOLE 150 MG PO TABS: ORAL_TABLET | ORAL | 0 refills | Status: DC

## 2024-06-13 ENCOUNTER — Other Ambulatory Visit: Payer: Self-pay

## 2024-06-13 ENCOUNTER — Telehealth: Payer: Self-pay

## 2024-06-13 DIAGNOSIS — B9689 Other specified bacterial agents as the cause of diseases classified elsewhere: Secondary | ICD-10-CM

## 2024-06-13 MED ORDER — FLUCONAZOLE 150 MG PO TABS: ORAL_TABLET | ORAL | 0 refills | Status: AC

## 2024-06-13 MED ORDER — METRONIDAZOLE 500 MG PO TABS: 500.0000 mg | ORAL_TABLET | Freq: Two times a day (BID) | ORAL | 0 refills | Status: AC

## 2024-06-13 NOTE — Telephone Encounter (Signed)
 Copied from CRM #[id number]. Topic: Clinical - Prescription Issue >> Jun 13, 2024  9:41 AM Robinson H wrote: Reason for CRM: Patient states her metroNIDAZOLE  (FLAGYL ) 500 MG tablet and fluconazole  (DIFLUCAN ) 150 MG tablet were sent to the wrong pharmacy, medication was sent to [facility] but should have been sent to the [facility] on file and below. States she verified pharmacy information in office but medication was still sent to the old pharmacy, agent updated information.  [facility] [address] [facility] ([place] [place] - [address] [phone] [address] [place] ([place]) [place] [address]   [place] [phone]  Prescriptions corrected and sent to pharmacy requested by patient. Nothing further needed at this time

## 2024-09-29 ENCOUNTER — Encounter: Admitting: Physician Assistant
